# Patient Record
Sex: Female | Born: 1975 | Race: White | Hispanic: No | Marital: Single | Smoking: Never smoker
Health system: Southern US, Community
[De-identification: ages and names within clinical notes are randomized; demographics above are authoritative.]

## PROBLEM LIST (undated history)

## (undated) DIAGNOSIS — F419 Anxiety disorder, unspecified: Secondary | ICD-10-CM

## (undated) DIAGNOSIS — E7800 Pure hypercholesterolemia, unspecified: Secondary | ICD-10-CM

## (undated) DIAGNOSIS — G4700 Insomnia, unspecified: Secondary | ICD-10-CM

## (undated) HISTORY — DX: Anxiety disorder, unspecified: F41.9

## (undated) HISTORY — DX: Insomnia, unspecified: G47.00

---

## 1999-03-31 ENCOUNTER — Other Ambulatory Visit: Admission: RE | Admit: 1999-03-31 | Discharge: 1999-03-31 | Payer: Self-pay | Admitting: Obstetrics and Gynecology

## 2000-04-06 ENCOUNTER — Other Ambulatory Visit: Admission: RE | Admit: 2000-04-06 | Discharge: 2000-04-06 | Payer: Self-pay | Admitting: Obstetrics and Gynecology

## 2001-07-02 ENCOUNTER — Other Ambulatory Visit: Admission: RE | Admit: 2001-07-02 | Discharge: 2001-07-02 | Payer: Self-pay | Admitting: Obstetrics and Gynecology

## 2002-07-14 ENCOUNTER — Other Ambulatory Visit: Admission: RE | Admit: 2002-07-14 | Discharge: 2002-07-14 | Payer: Self-pay | Admitting: Obstetrics and Gynecology

## 2003-07-23 ENCOUNTER — Other Ambulatory Visit: Admission: RE | Admit: 2003-07-23 | Discharge: 2003-07-23 | Payer: Self-pay | Admitting: Obstetrics and Gynecology

## 2004-07-27 ENCOUNTER — Other Ambulatory Visit: Admission: RE | Admit: 2004-07-27 | Discharge: 2004-07-27 | Payer: Self-pay | Admitting: Obstetrics and Gynecology

## 2005-05-13 ENCOUNTER — Emergency Department (HOSPITAL_COMMUNITY): Admission: EM | Admit: 2005-05-13 | Discharge: 2005-05-13 | Payer: Self-pay | Admitting: Emergency Medicine

## 2011-12-24 ENCOUNTER — Ambulatory Visit: Payer: 59 | Admitting: Family Medicine

## 2011-12-24 ENCOUNTER — Ambulatory Visit: Payer: 59

## 2011-12-24 VITALS — BP 110/71 | HR 110 | Temp 98.0°F | Resp 16 | Ht 67.25 in | Wt 106.8 lb

## 2011-12-24 DIAGNOSIS — M549 Dorsalgia, unspecified: Secondary | ICD-10-CM

## 2011-12-24 MED ORDER — PREDNISONE 20 MG PO TABS: ORAL_TABLET | ORAL | Status: DC

## 2011-12-24 MED ORDER — CYCLOBENZAPRINE HCL 10 MG PO TABS: 10.0000 mg | ORAL_TABLET | Freq: Two times a day (BID) | ORAL | Status: DC | PRN

## 2011-12-24 NOTE — Patient Instructions (Addendum)
Please let me know if your back is not doing a lot better in the next few days. You may also want to try some aleve or ibuprofen.

## 2011-12-24 NOTE — Progress Notes (Signed)
[facility] [address], [place] [address] [phone]  Date:  12/24/2011   Name:  Lucely Leard   DOB:  06-05-1975   MRN:  [id number]  PCP:  Almedia Balls, MD    Chief Complaint: Back Pain   History of Present Illness:  Biviana Saddler is a 36 y.o. very pleasant female patient who presents with the following:  She has noted pain in her back intermittently for the last several months- since July. It has been mostly on the left, but can hurt across her entire back.  The pain will radiate a little ways into her left buttock but does not run down the leg.  No numbness or weakness of her leg. no problems with bowel or bladder function.   She does not recall any injury to her back.  About 5 years ago she had "major lower back pain" and was told that she was constipated on her x-ray- she took laxatives and got better.  However, she has tried laxatives this time as well and it did not help.  She does not have any abdominal symptoms this time  She is generally healthy otherwise.    She is on OCP and states there is no chance of pregnancy Her LMP was this past thursday  There is no problem list on file for this patient.   History reviewed. No pertinent past medical history.  History reviewed. No pertinent past surgical history.  History  Substance Use Topics  . Smoking status: Never Smoker   . Smokeless tobacco: Not on file  . Alcohol Use: Not on file    Family History  Problem Relation Age of Onset  . Stroke Maternal Grandmother   . Heart disease Maternal Grandfather     No Known Allergies  Medication list has been reviewed and updated.  Current Outpatient Prescriptions on File Prior to Visit  Medication Sig Dispense Refill  . Norgestimate-Ethinyl Estradiol Triphasic (ORTHO TRI-CYCLEN LO) 0.18/0.215/0.25 MG-25 MCG tab Take 1 tablet by mouth daily.        Review of Systems:  As per HPI- otherwise negative.   Physical Examination: Filed Vitals:     12/24/11 1227  BP: 110/71  Pulse: 110  Temp: 98 F (36.7 C)  Resp: 16   Filed Vitals:   12/24/11 1227  Height: 5' 7.25" (1.708 m)  Weight: 106 lb 12.8 oz (48.444 kg)   Body mass index is 16.60 kg/(m^2). Ideal Body Weight: Weight in (lb) to have BMI = 25: 160.5   GEN: WDWN, NAD, Non-toxic, A & O x 3, thin build HEENT: Atraumatic, Normocephalic. Neck supple. No masses, No LAD. Ears and Nose: No external deformity. CV: RRR, No M/G/R. No JVD. No thrill. No extra heart sounds. PULM: CTA B, no wheezes, crackles, rhonchi. No retractions. No resp. distress. No accessory muscle use. ABD: S, NT, ND EXTR: No c/c/e NEURO Normal gait.  PSYCH: Normally interactive. Conversant. Not depressed or anxious appearing.  Calm demeanor.  Back: she has tenderness and spasm in her left lumbar muscles.  Positive SLR on left, slightly positive on right.  Constricted back flexion and extension.  Legs with normal strength, sensation and DTR.    [facility] reading (PRIMARY) by  Dr. Patsy Lager. Lumbar spine: mild lumbar scoliosis, otherwise normal   LUMBAR SPINE - COMPLETE 4+ VIEW  Comparison: 05/13/2005  Findings: Stable mild thoracolumbar levoscoliosis apex T12-L1 without underlying vertebral anomaly. Negative for fracture, dislocation, or other acute bony abnormality. Vertebral body and disc heights  well maintained throughout. No significant osseous degenerative change.  IMPRESSION:  Stable mild thoracolumbar levoscoliosis without fracture or other acute abnormality.  Assessment and Plan: 1. Back pain  DG Lumbar Spine Complete, predniSONE (DELTASONE) 20 MG tablet, cyclobenzaprine (FLEXERIL) 10 MG tablet   Lakrisha is having back pain which may be associated with nerve compression. She has a positive SLR on the left. We will treat with a short course of prednisone and PRN flexeril, as well as NSAIDs. She decline any other pain relief. She will let me know if not feeling better in the next few days - Sooner  if worse.   Meds ordered this encounter  Medications  . Norgestimate-Ethinyl Estradiol Triphasic (ORTHO TRI-CYCLEN LO) 0.18/0.215/0.25 MG-25 MCG tab    Sig: Take 1 tablet by mouth daily.  . predniSONE (DELTASONE) 20 MG tablet    Sig: Take 2 pills for 3 days, then 1 pill for 3 days    Dispense:  9 tablet    Refill:  0  . cyclobenzaprine (FLEXERIL) 10 MG tablet    Sig: Take 1 tablet (10 mg total) by mouth 2 (two) times daily as needed for muscle spasms.    Dispense:  30 tablet    Refill:  0     Lachae Hohler, MD

## 2014-08-21 ENCOUNTER — Emergency Department (HOSPITAL_COMMUNITY)
Admission: EM | Admit: 2014-08-21 | Discharge: 2014-08-21 | Disposition: A | Discharge status: Home / Self Care | Payer: 59 | Attending: Emergency Medicine | Admitting: Emergency Medicine

## 2014-08-21 ENCOUNTER — Encounter (HOSPITAL_COMMUNITY): Payer: Self-pay | Admitting: Emergency Medicine

## 2014-08-21 ENCOUNTER — Emergency Department (HOSPITAL_COMMUNITY): Payer: 59

## 2014-08-21 DIAGNOSIS — N946 Dysmenorrhea, unspecified: Secondary | ICD-10-CM | POA: Diagnosis not present

## 2014-08-21 DIAGNOSIS — Z793 Long term (current) use of hormonal contraceptives: Secondary | ICD-10-CM | POA: Diagnosis not present

## 2014-08-21 DIAGNOSIS — R1030 Lower abdominal pain, unspecified: Secondary | ICD-10-CM

## 2014-08-21 DIAGNOSIS — R52 Pain, unspecified: Secondary | ICD-10-CM

## 2014-08-21 LAB — CBC
HCT: 40.3 % (ref 36.0–46.0)
HEMOGLOBIN: 13.2 g/dL (ref 12.0–15.0)
MCH: 29.5 pg (ref 26.0–34.0)
MCHC: 32.8 g/dL (ref 30.0–36.0)
MCV: 90 fL (ref 78.0–100.0)
PLATELETS: 306 10*3/uL (ref 150–400)
RBC: 4.48 MIL/uL (ref 3.87–5.11)
RDW: 13.7 % (ref 11.5–15.5)
WBC: 8.6 10*3/uL (ref 4.0–10.5)

## 2014-08-21 LAB — BASIC METABOLIC PANEL
ANION GAP: 12 (ref 5–15)
BUN: 13 mg/dL (ref 6–20)
CALCIUM: 9 mg/dL (ref 8.9–10.3)
CO2: 18 mmol/L — ABNORMAL LOW (ref 22–32)
CREATININE: 0.67 mg/dL (ref 0.44–1.00)
Chloride: 107 mmol/L (ref 101–111)
Glucose, Bld: 99 mg/dL (ref 65–99)
Potassium: 3.4 mmol/L — ABNORMAL LOW (ref 3.5–5.1)
Sodium: 137 mmol/L (ref 135–145)

## 2014-08-21 MED ORDER — HYDROMORPHONE HCL 1 MG/ML IJ SOLN
1.0000 mg | Freq: Once | INTRAMUSCULAR | Status: AC
Start: 2014-08-21 — End: 2014-08-21
  Administered 2014-08-21: 1 mg via INTRAVENOUS
  Filled 2014-08-21: qty 1

## 2014-08-21 MED ORDER — ONDANSETRON HCL 4 MG/2ML IJ SOLN
4.0000 mg | Freq: Once | INTRAMUSCULAR | Status: AC
  Administered 2014-08-21: 4 mg via INTRAVENOUS
  Filled 2014-08-21: qty 2

## 2014-08-21 MED ORDER — SODIUM CHLORIDE 0.9 % IV BOLUS (SEPSIS)
1000.0000 mL | Freq: Once | INTRAVENOUS | Status: AC
  Administered 2014-08-21: 1000 mL via INTRAVENOUS

## 2014-08-21 MED ORDER — IBUPROFEN 800 MG PO TABS: 800.0000 mg | ORAL_TABLET | Freq: Three times a day (TID) | ORAL | Status: DC

## 2014-08-21 MED ORDER — HYDROCODONE-ACETAMINOPHEN 5-325 MG PO TABS: 1.0000 | ORAL_TABLET | Freq: Four times a day (QID) | ORAL | Status: DC | PRN

## 2014-08-21 NOTE — ED Notes (Signed)
Pt returned from US

## 2014-08-21 NOTE — ED Notes (Signed)
Pt transported to U/S with U/S tech.

## 2014-08-21 NOTE — Discharge Instructions (Signed)

## 2014-08-21 NOTE — ED Provider Notes (Signed)
CSN: [id number]     Arrival date & time 08/21/14  1537 History   First MD Initiated Contact with Patient 08/21/14 1547     Chief Complaint  Patient presents with  . menstrual pain      (Consider location/radiation/quality/duration/timing/severity/associated sxs/prior Treatment) HPI Comments: Here with recurrent menstrual pain. Having menstrual pain for the past 6 months as worsening. Not having worsening bleeding. Pain worsened acutely about 5 hours ago. Associated nausea but no fever or vomiting.  Patient is a 39 y.o. female presenting with cramps. The history is provided by the patient.  Abdominal Cramping This is a recurrent problem. The current episode started 3 to 5 hours ago. The problem occurs constantly. The problem has not changed since onset.Associated symptoms include abdominal pain. Pertinent negatives include no shortness of breath. Nothing aggravates the symptoms. Nothing relieves the symptoms.    History reviewed. No pertinent past medical history. History reviewed. No pertinent past surgical history. Family History  Problem Relation Age of Onset  . Stroke Maternal Grandmother   . Heart disease Maternal Grandfather    History  Substance Use Topics  . Smoking status: Never Smoker   . Smokeless tobacco: Not on file  . Alcohol Use: No   OB History    No data available     Review of Systems  Constitutional: Negative for fever.  Respiratory: Negative for cough and shortness of breath.   Gastrointestinal: Positive for nausea and abdominal pain. Negative for vomiting.  All other systems reviewed and are negative.     Allergies  Review of patient's allergies indicates no known allergies.  Home Medications   Prior to Admission medications   Medication Sig Start Date End Date Taking? Authorizing Provider  Norgestimate-Ethinyl Estradiol Triphasic (ORTHO TRI-CYCLEN LO) 0.18/0.215/0.25 MG-25 MCG tab Take 1 tablet by mouth daily.   Yes Historical Provider, MD   traMADol (ULTRAM) 50 MG tablet Take 50-100 mg by mouth every 6 (six) hours as needed for moderate pain or severe pain.   Yes Historical Provider, MD   BP 132/78 mmHg  Pulse 79  Temp(Src) 97.9 F (36.6 C) (Oral)  Resp 18  SpO2 100%  LMP 08/21/2014 Physical Exam  Constitutional: She is oriented to person, place, and time. She appears well-developed and well-nourished. No distress.  HENT:  Head: Normocephalic and atraumatic.  Mouth/Throat: Oropharynx is clear and moist.  Eyes: EOM are normal. Pupils are equal, round, and reactive to light.  Neck: Normal range of motion. Neck supple.  Cardiovascular: Normal rate and regular rhythm.  Exam reveals no friction rub.   No murmur heard. Pulmonary/Chest: Effort normal and breath sounds normal. No respiratory distress. She has no wheezes. She has no rales.  Abdominal: Soft. She exhibits no distension. There is tenderness (diffuse, lower abdomen). There is no rebound.  Musculoskeletal: Normal range of motion. She exhibits no edema.  Neurological: She is alert and oriented to person, place, and time. No cranial nerve deficit. She exhibits normal muscle tone. Coordination normal.  Skin: No rash noted. She is not diaphoretic.  Nursing note and vitals reviewed.   ED Course  Procedures (including critical care time) Labs Review Labs Reviewed  CBC  BASIC METABOLIC PANEL  URINALYSIS, ROUTINE W REFLEX MICROSCOPIC (NOT AT [facility])    Imaging Review US Transvaginal Non-ob  08/21/2014   CLINICAL DATA:  Bilateral pelvic pain  EXAM: TRANSABDOMINAL AND TRANSVAGINAL ULTRASOUND OF PELVIS  DOPPLER ULTRASOUND OF OVARIES  TECHNIQUE: Both transabdominal and transvaginal ultrasound examinations of the pelvis were performed.  Transabdominal technique was performed for global imaging of the pelvis including uterus, ovaries, adnexal regions, and pelvic cul-de-sac. It was necessary to proceed with endovaginal exam following the transabdominal exam to visualize the  ovaries.Color and duplex Doppler ultrasound was utilized to evaluate blood flow to the ovaries.  COMPARISON:  None.  FINDINGS: Uterus  Measurements: 10.5 x 5.8 x 5.2 cm. No fibroids or other mass visualized.  Endometrium  Thickness: 18 mm. There is a focal filling defect which appears oval in solid measuring 1.9 x 1.3 x 1.8 cm  Right ovary  Measurements: 2.7 x 1.5 x 2.2 cm. Normal appearance/no adnexal mass.  Left ovary  Measurements: 3.2 x 1.5 x 2.7 cm. Normal appearance/no adnexal mass.  Pulsed Doppler evaluation of both ovaries demonstrates normal low-resistance arterial and venous waveforms.  Other findings  Trace free fluid noted within the pelvis.  IMPRESSION: 1. No explanation for patient's severe bilateral pelvic pain. No evidence for ovarian torsion. 2. Focal filling defect within the endometrium is identified. This may represent a polyp. Small endometrial carcinoma is less favored. Recommend sonohysterogram for further evaluation, prior to hysteroscopy or endometrial biopsy.   Electronically Signed   By: Kerby Moors M.D.   On: 08/21/2014 17:37   US Pelvis Complete  08/21/2014   CLINICAL DATA:  Bilateral pelvic pain  EXAM: TRANSABDOMINAL AND TRANSVAGINAL ULTRASOUND OF PELVIS  DOPPLER ULTRASOUND OF OVARIES  TECHNIQUE: Both transabdominal and transvaginal ultrasound examinations of the pelvis were performed. Transabdominal technique was performed for global imaging of the pelvis including uterus, ovaries, adnexal regions, and pelvic cul-de-sac. It was necessary to proceed with endovaginal exam following the transabdominal exam to visualize the ovaries.Color and duplex Doppler ultrasound was utilized to evaluate blood flow to the ovaries.  COMPARISON:  None.  FINDINGS: Uterus  Measurements: 10.5 x 5.8 x 5.2 cm. No fibroids or other mass visualized.  Endometrium  Thickness: 18 mm. There is a focal filling defect which appears oval in solid measuring 1.9 x 1.3 x 1.8 cm  Right ovary  Measurements: 2.7 x 1.5  x 2.2 cm. Normal appearance/no adnexal mass.  Left ovary  Measurements: 3.2 x 1.5 x 2.7 cm. Normal appearance/no adnexal mass.  Pulsed Doppler evaluation of both ovaries demonstrates normal low-resistance arterial and venous waveforms.  Other findings  Trace free fluid noted within the pelvis.  IMPRESSION: 1. No explanation for patient's severe bilateral pelvic pain. No evidence for ovarian torsion. 2. Focal filling defect within the endometrium is identified. This may represent a polyp. Small endometrial carcinoma is less favored. Recommend sonohysterogram for further evaluation, prior to hysteroscopy or endometrial biopsy.   Electronically Signed   By: Kerby Moors M.D.   On: 08/21/2014 17:37   [place] Art/ven Flow Abd Pelv Doppler  08/21/2014   CLINICAL DATA:  Bilateral pelvic pain  EXAM: TRANSABDOMINAL AND TRANSVAGINAL ULTRASOUND OF PELVIS  DOPPLER ULTRASOUND OF OVARIES  TECHNIQUE: Both transabdominal and transvaginal ultrasound examinations of the pelvis were performed. Transabdominal technique was performed for global imaging of the pelvis including uterus, ovaries, adnexal regions, and pelvic cul-de-sac. It was necessary to proceed with endovaginal exam following the transabdominal exam to visualize the ovaries.Color and duplex Doppler ultrasound was utilized to evaluate blood flow to the ovaries.  COMPARISON:  None.  FINDINGS: Uterus  Measurements: 10.5 x 5.8 x 5.2 cm. No fibroids or other mass visualized.  Endometrium  Thickness: 18 mm. There is a focal filling defect which appears oval in solid measuring 1.9 x 1.3 x 1.8 cm  Right ovary  Measurements: 2.7 x 1.5 x 2.2 cm. Normal appearance/no adnexal mass.  Left ovary  Measurements: 3.2 x 1.5 x 2.7 cm. Normal appearance/no adnexal mass.  Pulsed Doppler evaluation of both ovaries demonstrates normal low-resistance arterial and venous waveforms.  Other findings  Trace free fluid noted within the pelvis.  IMPRESSION: 1. No explanation for patient's severe  bilateral pelvic pain. No evidence for ovarian torsion. 2. Focal filling defect within the endometrium is identified. This may represent a polyp. Small endometrial carcinoma is less favored. Recommend sonohysterogram for further evaluation, prior to hysteroscopy or endometrial biopsy.   Electronically Signed   By: Kerby Moors M.D.   On: 08/21/2014 17:37     EKG Interpretation None      MDM   Final diagnoses:  Diffuse pain  Lower abdominal pain  Dysmenorrhea    38 year old female here with acutely worsening lower abdominal pain. Concern for torsion. Will obtain ultrasound. Pain meds and fluids given.  Ultrasound normal. Patient feeling better after pain meds. Patient's dysmenorrhea has been chronic and will give pain meds and follow-up with gynecology. Patient refuses any further pelvic exam after her ultrasound.  Evelina Bucy, MD 08/22/14 Laureen Abrahams

## 2014-08-21 NOTE — ED Notes (Signed)
Per pt, states severe menstrual pain, cramping-feels worse than labor-has taken 3 tramadol with no relief

## 2015-06-22 ENCOUNTER — Emergency Department (HOSPITAL_COMMUNITY): Payer: 59

## 2015-06-22 ENCOUNTER — Emergency Department (HOSPITAL_COMMUNITY)
Admission: EM | Admit: 2015-06-22 | Discharge: 2015-06-22 | Disposition: A | Discharge status: Home / Self Care | Payer: 59 | Attending: Emergency Medicine | Admitting: Emergency Medicine

## 2015-06-22 ENCOUNTER — Encounter (HOSPITAL_COMMUNITY): Payer: Self-pay

## 2015-06-22 DIAGNOSIS — R5383 Other fatigue: Secondary | ICD-10-CM

## 2015-06-22 DIAGNOSIS — R059 Cough, unspecified: Secondary | ICD-10-CM

## 2015-06-22 DIAGNOSIS — F419 Anxiety disorder, unspecified: Secondary | ICD-10-CM | POA: Insufficient documentation

## 2015-06-22 DIAGNOSIS — R42 Dizziness and giddiness: Secondary | ICD-10-CM | POA: Diagnosis not present

## 2015-06-22 DIAGNOSIS — R002 Palpitations: Secondary | ICD-10-CM | POA: Diagnosis present

## 2015-06-22 DIAGNOSIS — Z8709 Personal history of other diseases of the respiratory system: Secondary | ICD-10-CM | POA: Insufficient documentation

## 2015-06-22 DIAGNOSIS — Z79899 Other long term (current) drug therapy: Secondary | ICD-10-CM | POA: Insufficient documentation

## 2015-06-22 DIAGNOSIS — R05 Cough: Secondary | ICD-10-CM | POA: Insufficient documentation

## 2015-06-22 MED ORDER — LORAZEPAM 1 MG PO TABS
1.0000 mg | ORAL_TABLET | Freq: Once | ORAL | Status: AC
  Administered 2015-06-22: 1 mg via ORAL
  Filled 2015-06-22: qty 1

## 2015-06-22 MED ORDER — LORAZEPAM 1 MG PO TABS: 1.0000 mg | ORAL_TABLET | Freq: Three times a day (TID) | ORAL | Status: DC | PRN

## 2015-06-22 NOTE — Discharge Instructions (Signed)
Fatigue  Fatigue is feeling tired all of the time, a lack of energy, or a lack of motivation. Occasional or mild fatigue is often a normal response to activity or life in general. However, long-lasting (chronic) or extreme fatigue may indicate an underlying medical condition.  HOME CARE INSTRUCTIONS   Watch your fatigue for any changes. The following actions may help to lessen any discomfort you are feeling:  · Talk to your health care provider about how much sleep you need each night. Try to get the required amount every night.  · Take medicines only as directed by your health care provider.  · Eat a healthy and nutritious diet. Ask your health care provider if you need help changing your diet.  · Drink enough fluid to keep your urine clear or pale yellow.  · Practice ways of relaxing, such as yoga, meditation, massage therapy, or acupuncture.  · Exercise regularly.    · Change situations that cause you stress. Try to keep your work and personal routine reasonable.  · Do not abuse illegal drugs.  · Limit alcohol intake to no more than 1 drink per day for nonpregnant women and 2 drinks per day for men. One drink equals 12 ounces of beer, 5 ounces of wine, or 1½ ounces of hard liquor.  · Take a multivitamin, if directed by your health care provider.  SEEK MEDICAL CARE IF:   · Your fatigue does not get better.  · You have a fever.    · You have unintentional weight loss or gain.  · You have headaches.    · You have difficulty:      Falling asleep.    Sleeping throughout the night.  · You feel angry, guilty, anxious, or sad.     · You are unable to have a bowel movement (constipation).    · You skin is dry.     · Your legs or another part of your body is swollen.    SEEK IMMEDIATE MEDICAL CARE IF:   · You feel confused.    · Your vision is blurry.  · You feel faint or pass out.    · You have a severe headache.    · You have severe abdominal, pelvic, or back pain.    · You have chest pain, shortness of breath, or an  irregular or fast heartbeat.    · You are unable to urinate or you urinate less than normal.    · You develop abnormal bleeding, such as bleeding from the rectum, vagina, nose, lungs, or nipples.  · You vomit blood.     · You have thoughts about harming yourself or committing suicide.    · You are worried that you might harm someone else.       This information is not intended to replace advice given to you by your health care provider. Make sure you discuss any questions you have with your health care provider.     Document Released: 12/11/2006 Document Revised: 03/06/2014 Document Reviewed: 06/17/2013  Elsevier Interactive Patient Education ©2016 [employer]

## 2015-06-22 NOTE — ED Notes (Signed)
Pt here with nausea, fatigue, loss of appetite, no fever, no chest pain. Cough, dull pain with cough, some shortness of breath.

## 2015-06-22 NOTE — ED Notes (Signed)
Patient was alert, oriented and stable upon discharge. RN went over AVS and patient had no further questions. Pt advised not to drive on ativan.

## 2015-06-22 NOTE — ED Provider Notes (Signed)
CSN: [id number]     Arrival date & time 06/22/15  1832 History  By signing my name below, I, Emmanuella Mensah, attest that this documentation has been prepared under the direction and in the presence of Delos Haring, PA-C. Electronically Signed: Judithann Sauger, ED Scribe. 06/22/2015. 9:21 PM.    Chief Complaint  Patient presents with  . Fatigue  . Cough   The history is provided by the patient. No language interpreter was used.   HPI Comments: Lacey Hernandez is a 40 y.o. female who presents to the Emergency Department complaining of persistent intermittent cough onset 2 weeks ago. Pt reports associated fatigue, palpitations, and severe anxiety. She states that she has increased stress as she has been researching what could be wrong with her on [place] and taking Ambien to fall asleep which usually makes her more anxious and not sleep. Pt explains that she was diagnosed with influenza approx. 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 She adds that she went to [facility] 4 days ago where she had normal CBC w /dif and TSH/free 4 test. No alleviating factors noted. Pt has not tried any medications PTA. She said that she is so anxious and thinks she needs an antibiotic. She said she has never been sick before so this is new territory for her. Patient has her knees balled into her chest and her face buried into her scarf. Denies HI/SI, AVH. Substance abuse or alcohol abuse. Denies wanting psych consult. She denies any fever, chills, sore throat, rhinorrhea, HA, or n/v.    History reviewed. No pertinent past medical history. History reviewed. No pertinent past surgical history. Family History  Problem Relation Age of Onset  . Stroke Maternal Grandmother   . Heart disease Maternal Grandfather    Social History  Substance Use Topics  . Smoking status: Never Smoker   . Smokeless tobacco: None  . Alcohol Use: No   OB History     No data available     Review of Systems  Constitutional: Positive for fatigue. Negative for fever and chills.  HENT: Negative for rhinorrhea and sore throat.   Respiratory: Positive for cough and shortness of breath.   Gastrointestinal: Negative for nausea and vomiting.  Neurological: Negative for headaches.      Allergies  Review of patient's allergies indicates no known allergies.  Home Medications   Prior to Admission medications   Medication Sig Start Date End Date Taking? Authorizing Provider  ALPRAZolam Duanne Moron) 0.25 MG tablet take 1 tablet by mouth three times a day if needed for anxiety 06/20/15  Yes Historical Provider, MD  norethindrone-ethinyl estradiol-iron (ESTROSTEP FE,TILIA FE,TRI-LEGEST FE) 1-20/1-30/1-35 MG-MCG tablet Take 1 tablet by mouth daily. Reported on 06/22/2015   Yes Historical Provider, MD  zolpidem (AMBIEN) 5 MG tablet Take 5 mg by mouth at bedtime.    Yes Historical Provider, MD  HYDROcodone-acetaminophen (NORCO/VICODIN) 5-325 MG per tablet Take 1 tablet by mouth every 6 (six) hours as needed for moderate pain. Patient not taking: Reported on 06/22/2015 08/21/14   Evelina Bucy, MD  ibuprofen (ADVIL,MOTRIN) 800 MG tablet Take 1 tablet (800 mg total) by mouth 3 (three) times daily. Patient not taking: Reported on 06/22/2015 08/21/14   Evelina Bucy, MD  LORazepam (ATIVAN) 1 MG tablet Take 1 tablet (1 mg total) by mouth 3 (three) times daily as needed for anxiety. 06/22/15   Tayvia Faughnan Carlota Raspberry, PA-C   BP 111/50  mmHg  Pulse 92  Temp(Src) 98 F (36.7 C) (Oral)  Resp 20  SpO2 99%  LMP 05/12/2015 Physical Exam  Constitutional: She is oriented to person, place, and time. She appears well-developed and well-nourished. No distress.  HENT:  Head: Normocephalic and atraumatic.  Eyes: Conjunctivae and EOM are normal.  Cardiovascular: Normal rate.   Pulmonary/Chest: Effort normal and breath sounds normal. No accessory muscle usage. No respiratory distress. She has no  decreased breath sounds. She has no wheezes. She has no rhonchi.  Abdominal: Bowel sounds are normal. There is no tenderness. There is no rigidity, no rebound and no guarding.  Musculoskeletal: Normal range of motion.  Neurological: She is alert and oriented to person, place, and time.  Skin: Skin is warm and dry.  Psychiatric: Her behavior is normal. Her mood appears anxious. Her speech is rapid and/or pressured. She is not actively hallucinating. Thought content is not paranoid and not delusional. She does not exhibit a depressed mood. She expresses no homicidal and no suicidal ideation. She expresses no suicidal plans and no homicidal plans.  Nursing note and vitals reviewed.   ED Course  Procedures (including critical care time) DIAGNOSTIC STUDIES: Oxygen Saturation is 99% on RA, normal by my interpretation.    COORDINATION OF CARE: 9:14 PM- Pt advised of plan for treatment and pt agrees. Pt informed of x-ray results. Pt will receive Ativan. Advised to stop taking Ambien.    Labs Review Labs Reviewed - No data to display  Imaging Review Dg Chest 2 View  06/22/2015  CLINICAL DATA:  Cough, congestion shortness of breath for 1 day. Patient diagnosed with flu 2 weeks ago. Initial encounter. EXAM: CHEST  2 VIEW COMPARISON:  None. FINDINGS: The lungs are clear. Heart size is normal. No pneumothorax or pleural effusion. No focal bony abnormality. IMPRESSION: Negative chest. Electronically Signed   By: Inge Rise M.D.   On: 06/22/2015 19:37   Geneen Dieter Carlota Raspberry, PA-C has personally reviewed and evaluated these images and lab results as part of her medical decision-making.   EKG Interpretation None      MDM   Final diagnoses:  Other fatigue  Cough  Anxiety    The patient is very anxious, given Ativan in the ED which she says has helped. Because of her anxiety she refuses blood work today. She wants to try treating her anxiety first and then see her PCP or return to the ED for  work-up. She reports being "deathly afraid" of needles and can't do it. She appears well with normal vital signs. Had a normal CBC w/dif and TSH. I think her symptoms are related to the anxiety and her recent bought of flu.  I personally performed the services described in this documentation, which was scribed in my presence. The recorded information has been reviewed and is accurate.   Delos Haring, PA-C 06/22/15 2158  Wandra Arthurs, MD 06/24/15 Vernelle Emerald

## 2015-07-08 ENCOUNTER — Other Ambulatory Visit: Payer: Self-pay | Admitting: Obstetrics and Gynecology

## 2015-07-29 DIAGNOSIS — L988 Other specified disorders of the skin and subcutaneous tissue: Secondary | ICD-10-CM | POA: Insufficient documentation

## 2015-10-06 ENCOUNTER — Ambulatory Visit
Admission: RE | Admit: 2015-10-06 | Discharge: 2015-10-06 | Disposition: A | Discharge status: Home / Self Care | Payer: 59 | Source: Ambulatory Visit | Attending: Obstetrics and Gynecology | Admitting: Obstetrics and Gynecology

## 2015-10-06 ENCOUNTER — Other Ambulatory Visit: Payer: Self-pay | Admitting: Obstetrics and Gynecology

## 2015-10-06 DIAGNOSIS — R928 Other abnormal and inconclusive findings on diagnostic imaging of breast: Secondary | ICD-10-CM

## 2017-10-16 ENCOUNTER — Emergency Department (HOSPITAL_COMMUNITY): Payer: Managed Care, Other (non HMO)

## 2017-10-16 ENCOUNTER — Emergency Department (HOSPITAL_COMMUNITY)
Admission: EM | Admit: 2017-10-16 | Discharge: 2017-10-16 | Disposition: A | Discharge status: Home / Self Care | Payer: Managed Care, Other (non HMO) | Attending: Emergency Medicine | Admitting: Emergency Medicine

## 2017-10-16 ENCOUNTER — Encounter (HOSPITAL_COMMUNITY): Payer: Self-pay

## 2017-10-16 ENCOUNTER — Other Ambulatory Visit: Payer: Self-pay

## 2017-10-16 DIAGNOSIS — N10 Acute tubulo-interstitial nephritis: Secondary | ICD-10-CM | POA: Diagnosis not present

## 2017-10-16 DIAGNOSIS — R109 Unspecified abdominal pain: Secondary | ICD-10-CM | POA: Diagnosis present

## 2017-10-16 DIAGNOSIS — N12 Tubulo-interstitial nephritis, not specified as acute or chronic: Secondary | ICD-10-CM

## 2017-10-16 DIAGNOSIS — Z79899 Other long term (current) drug therapy: Secondary | ICD-10-CM | POA: Insufficient documentation

## 2017-10-16 LAB — URINALYSIS, ROUTINE W REFLEX MICROSCOPIC
Bilirubin Urine: NEGATIVE
GLUCOSE, UA: NEGATIVE mg/dL
KETONES UR: 20 mg/dL — AB
NITRITE: POSITIVE — AB
PH: 6 (ref 5.0–8.0)
PROTEIN: NEGATIVE mg/dL
Specific Gravity, Urine: 1.014 (ref 1.005–1.030)

## 2017-10-16 LAB — CBC
HCT: 37.7 % (ref 36.0–46.0)
Hemoglobin: 12.1 g/dL (ref 12.0–15.0)
MCH: 28.3 pg (ref 26.0–34.0)
MCHC: 32.1 g/dL (ref 30.0–36.0)
MCV: 88.1 fL (ref 78.0–100.0)
PLATELETS: 320 10*3/uL (ref 150–400)
RBC: 4.28 MIL/uL (ref 3.87–5.11)
RDW: 15 % (ref 11.5–15.5)
WBC: 10.2 10*3/uL (ref 4.0–10.5)

## 2017-10-16 LAB — COMPREHENSIVE METABOLIC PANEL
ALK PHOS: 76 U/L (ref 38–126)
ALT: 14 U/L (ref 0–44)
AST: 17 U/L (ref 15–41)
Albumin: 4.1 g/dL (ref 3.5–5.0)
Anion gap: 12 (ref 5–15)
BILIRUBIN TOTAL: 0.9 mg/dL (ref 0.3–1.2)
BUN: 14 mg/dL (ref 6–20)
CALCIUM: 8.8 mg/dL — AB (ref 8.9–10.3)
CO2: 20 mmol/L — ABNORMAL LOW (ref 22–32)
Chloride: 110 mmol/L (ref 98–111)
Creatinine, Ser: 0.93 mg/dL (ref 0.44–1.00)
GFR calc non Af Amer: >60 mL/min (ref >60)
Glucose, Bld: 115 mg/dL — ABNORMAL HIGH (ref 70–99)
Potassium: 3.2 mmol/L — ABNORMAL LOW (ref 3.5–5.1)
Sodium: 142 mmol/L (ref 135–145)
TOTAL PROTEIN: 7.2 g/dL (ref 6.5–8.1)

## 2017-10-16 LAB — I-STAT BETA HCG BLOOD, ED (MC, WL, AP ONLY): I-stat hCG, quantitative: <5 m[IU]/mL (ref <5)

## 2017-10-16 MED ORDER — IBUPROFEN 800 MG PO TABS: 800.0000 mg | ORAL_TABLET | Freq: Three times a day (TID) | ORAL | 0 refills | Status: DC | PRN

## 2017-10-16 MED ORDER — HYDROMORPHONE HCL 1 MG/ML IJ SOLN
1.0000 mg | Freq: Once | INTRAMUSCULAR | Status: AC
  Administered 2017-10-16: 1 mg via INTRAVENOUS
  Filled 2017-10-16: qty 1

## 2017-10-16 MED ORDER — SODIUM CHLORIDE 0.9 % IV SOLN
1.0000 g | Freq: Once | INTRAVENOUS | Status: AC
  Administered 2017-10-16: 1 g via INTRAVENOUS
  Filled 2017-10-16: qty 10

## 2017-10-16 MED ORDER — ONDANSETRON 4 MG PO TBDP: 4.0000 mg | ORAL_TABLET | Freq: Three times a day (TID) | ORAL | 0 refills | Status: DC | PRN

## 2017-10-16 MED ORDER — CEPHALEXIN 500 MG PO CAPS: 500.0000 mg | ORAL_CAPSULE | Freq: Four times a day (QID) | ORAL | 0 refills | Status: DC

## 2017-10-16 MED ORDER — POTASSIUM CHLORIDE CRYS ER 20 MEQ PO TBCR: 20.0000 meq | EXTENDED_RELEASE_TABLET | Freq: Every day | ORAL | 0 refills | Status: DC

## 2017-10-16 MED ORDER — OXYCODONE-ACETAMINOPHEN 5-325 MG PO TABS: 1.0000 | ORAL_TABLET | Freq: Four times a day (QID) | ORAL | 0 refills | Status: DC | PRN

## 2017-10-16 MED ORDER — KETOROLAC TROMETHAMINE 15 MG/ML IJ SOLN
15.0000 mg | Freq: Once | INTRAMUSCULAR | Status: AC
  Administered 2017-10-16: 15 mg via INTRAVENOUS
  Filled 2017-10-16: qty 1

## 2017-10-16 MED ORDER — MORPHINE SULFATE (PF) 4 MG/ML IV SOLN
4.0000 mg | Freq: Once | INTRAVENOUS | Status: AC
  Administered 2017-10-16: 4 mg via INTRAVENOUS
  Filled 2017-10-16: qty 1

## 2017-10-16 MED ORDER — SODIUM CHLORIDE 0.9 % IV BOLUS
1000.0000 mL | Freq: Once | INTRAVENOUS | Status: AC
  Administered 2017-10-16: 1000 mL via INTRAVENOUS

## 2017-10-16 MED ORDER — ONDANSETRON HCL 4 MG/2ML IJ SOLN
4.0000 mg | Freq: Once | INTRAMUSCULAR | Status: AC
  Administered 2017-10-16: 4 mg via INTRAVENOUS
  Filled 2017-10-16: qty 2

## 2017-10-16 NOTE — ED Notes (Signed)
Patient verbalized understanding of discharge instructions, no questions. Patient ambulated out of ED with steady gait in no distress.

## 2017-10-16 NOTE — ED Provider Notes (Signed)
[facility] DEPT Provider Note   CSN: [id number] Arrival date & time: 10/16/17  1013   History   Chief Complaint Chief Complaint  Patient presents with  . Flank Pain  . Urinary Retention  . Urinary Frequency    HPI Lacey Hernandez is a 42 y.o. female without significant past medical history who presents to the emergency department with complaints of left flank pain which is been occurring intermittently for the past 3 days, significantly worse today.  Pain has become constant now, does not radiate, 10 out of 10 in severity, no specific alleviating or aggravating factors.  She did take 800 mg of ibuprofen 1 hour prior to arrival.  She had associated nausea, a few episodes of vomiting, urinary urgency, urinary frequency, and feeling as if she has not emptied her bladder fully.  She has been urinating, not necessarily having urinary retention.  No history of similar symptoms.  Denies chance of pregnancy.  Denies fever, chills, vaginal bleeding, vaginal discharge, dysuria, numbness, weakness, incontinence, saddle anesthesia, IVDU, history of cancer, recent traumatic injury.  Denies history of similar.  HPI  History reviewed. No pertinent past medical history.  There are no active problems to display for this patient.   History reviewed. No pertinent surgical history.   OB History   None      Home Medications    Prior to Admission medications   Medication Sig Start Date End Date Taking? Authorizing Provider  ALPRAZolam Duanne Moron) 0.25 MG tablet take 1 tablet by mouth three times a day if needed for anxiety 06/20/15   [provider]  HYDROcodone-acetaminophen (NORCO/VICODIN) 5-325 MG per tablet Take 1 tablet by mouth every 6 (six) hours as needed for moderate pain. Patient not taking: Reported on 06/22/2015 08/21/14   Evelina Bucy, MD  ibuprofen (ADVIL,MOTRIN) 800 MG tablet Take 1 tablet (800 mg total) by mouth 3 (three) times daily. Patient not  taking: Reported on 06/22/2015 08/21/14   Evelina Bucy, MD  LORazepam (ATIVAN) 1 MG tablet Take 1 tablet (1 mg total) by mouth 3 (three) times daily as needed for anxiety. 06/22/15   Delos Haring, PA-C  norethindrone-ethinyl estradiol-iron (ESTROSTEP FE,TILIA FE,TRI-LEGEST FE) 1-20/1-30/1-35 MG-MCG tablet Take 1 tablet by mouth daily. Reported on 06/22/2015    [provider]  zolpidem (AMBIEN) 5 MG tablet Take 5 mg by mouth at bedtime.     [provider]    Family History Family History  Problem Relation Age of Onset  . Stroke Maternal Grandmother   . Heart disease Maternal Grandfather     Social History Social History   Tobacco Use  . Smoking status: Never Smoker  . Smokeless tobacco: Never Used  Substance Use Topics  . Alcohol use: No  . Drug use: No     Allergies   Patient has no known allergies.   Review of Systems Review of Systems  All other systems reviewed and are negative.    Physical Exam Updated Vital Signs BP 120/74 (BP Location: Right Arm)   Pulse 78   Temp 98.6 F (37 C) (Oral)   Resp 20   Ht 5' 8"  (1.727 m)   Wt 68 kg   LMP 09/25/2017   SpO2 100%   BMI 22.81 kg/m   Physical Exam  Constitutional: She appears well-developed and well-nourished.  Non-toxic appearance. She appears distressed (mild secondary to discomfort).  HENT:  Head: Normocephalic and atraumatic.  Eyes: Conjunctivae are normal. Right eye exhibits no discharge. Left eye exhibits  no discharge.  Neck: Neck supple.  Cardiovascular: Normal rate and regular rhythm.  No murmur heard. Pulmonary/Chest: Effort normal and breath sounds normal. No respiratory distress. She has no wheezes. She has no rhonchi. She has no rales.  Respiration even and unlabored  Abdominal: Soft. Bowel sounds are normal. She exhibits no distension. There is no tenderness. There is CVA tenderness (L). There is no rebound and no guarding.  Neurological: She is alert.  Clear speech.   Skin:  Skin is warm and dry. No rash noted.  Psychiatric: She has a normal mood and affect. Her behavior is normal.  Nursing note and vitals reviewed.    ED Treatments / Results  Labs (all labs ordered are listed, but only abnormal results are displayed) Labs Reviewed  URINALYSIS, ROUTINE W REFLEX MICROSCOPIC - Abnormal; Notable for the following components:      Result Value   APPearance HAZY (*)    Hgb urine dipstick MODERATE (*)    Ketones, ur 20 (*)    Nitrite POSITIVE (*)    Leukocytes, UA SMALL (*)    WBC, UA >50 (*)    Bacteria, UA MANY (*)    All other components within normal limits  COMPREHENSIVE METABOLIC PANEL - Abnormal; Notable for the following components:   Potassium 3.2 (*)    CO2 20 (*)    Glucose, Bld 115 (*)    Calcium 8.8 (*)    All other components within normal limits  URINE CULTURE  CBC  I-STAT BETA HCG BLOOD, ED ([facility], [facility], [facility] ONLY)    EKG None  Radiology Ct Renal Stone Study  Result Date: 10/16/2017 CLINICAL DATA:  Left flank pain. Urinary frequency and retention for 2 days. EXAM: CT ABDOMEN AND PELVIS WITHOUT CONTRAST TECHNIQUE: Multidetector CT imaging of the abdomen and pelvis was performed following the standard protocol without IV contrast. COMPARISON:  None. FINDINGS: Lower chest: No significant pulmonary nodules or acute consolidative airspace disease. Hepatobiliary: Normal liver size. Simple 1.2 cm posterior right liver lobe cyst. No additional liver lesions. Normal gallbladder with no radiopaque cholelithiasis. No biliary ductal dilatation. Pancreas: Normal, with no mass or duct dilation. Spleen: Normal size. No mass. Adrenals/Urinary Tract: Normal adrenals. There is mild fullness of the central left renal collecting system without overt left hydronephrosis. There is fat stranding surrounding central left renal collecting system. No left renal stones. Punctate nonobstructing upper right renal stone. No right hydronephrosis. Ureters are normal caliber, with  no ureteral stones. No contour deforming renal mass. Normal bladder. Stomach/Bowel: Normal non-distended stomach. Normal caliber small bowel with no small bowel wall thickening. Normal appendix. Normal large bowel with no diverticulosis, large bowel wall thickening or pericolonic fat stranding. Vascular/Lymphatic: Normal caliber abdominal aorta. No pathologically enlarged lymph nodes in the abdomen or pelvis. Reproductive: Grossly normal uterus.  No adnexal mass. Other: No pneumoperitoneum, ascites or focal fluid collection. Small fat containing periumbilical hernia. Musculoskeletal: No aggressive appearing focal osseous lesions. Moderate degenerative disc disease at L5-S1. IMPRESSION: 1. Mild fullness of the central left renal collecting system without overt left hydronephrosis. Fat stranding surrounding the central left renal collecting system. No stones in the left kidney or left ureter. These findings are nonspecific and could be due to recent passage of a left urinary tract stone. An ascending left urinary tract infection is not entirely excluded. 2. Punctate nonobstructing right renal stone. Electronically Signed   By: Ilona Sorrel M.D.   On: 10/16/2017 12:34    Procedures Procedures (including critical care time)  Medications Ordered in ED Medications  ketorolac (TORADOL) 15 MG/ML injection 15 mg (has no administration in time range)  morphine 4 MG/ML injection 4 mg (4 mg Intravenous Given 10/16/17 1059)  ondansetron (ZOFRAN) injection 4 mg (4 mg Intravenous Given 10/16/17 1059)  sodium chloride 0.9 % bolus 1,000 mL (0 mLs Intravenous Stopped 10/16/17 1452)  HYDROmorphone (DILAUDID) injection 1 mg (1 mg Intravenous Given 10/16/17 1141)  cefTRIAXone (ROCEPHIN) 1 g in sodium chloride 0.9 % 100 mL IVPB (0 g Intravenous Stopped 10/16/17 1519)     Initial Impression / Assessment and Plan / ED Course  I have reviewed the triage vital signs and the nursing notes.  Pertinent labs & imaging results that  were available during my care of the patient were reviewed by me and considered in my medical decision making (see chart for details).   Patient presents to the emergency department with complaints of left flank pain and associated urinary symptoms.  Upon arrival patient is nontoxic-appearing, she does appear uncomfortable secondary to pain, her vitals initially are within normal limits.  She does have some left CVA tenderness on exam.  DDX: nephrolithiasis, pyelonephritis/UTI, cholecystitis, bowel obstruction/perforation, appendicitis, pancreatitis, dissection, MSK, ovarian torsion. Given otherwise young healthy female, normotensive, with urinary sxs, doubt dissection. No pelvic pain or abdominal/pelvic tenderness to raise concern for torsion. Again no abdominal tenderness to raise concern for pancreatitis, cholecystitis or bowel obstruction/perforation. Feel nephrolithiasis vs. pyelonephritis is most likely at this time. Will evaluate with labs and CT renal study, analgesics, anti-emetics, and fluids ordered.   No leukocytosis, anemia, or significant electrolyte derangements- mild hypocalcemia/hypokalemia. Renal function and LFTs WNL.  CT renal study reviewed: Mild fullness of the central left renal collecting system without overt left hydronephrosis. Fat stranding surrounding the central left renal collecting system. No stones in the left kidney or left ureter. These findings are nonspecific and could be due to recent passage of a left urinary tract stone. An ascending left urinary tract infection is not entirely excluded. Additionally noted is a punctate nonobstructing right renal stone.  Based on patient's urinalysis I feel that ascending left-sided UTI consistent with pyelonephritis is more likely, she is nitrite positive with many bacteria, many WBCs, some leukocytes, and hematuria.  Renal function is preserved. Passed stone remains a possibility. Patient given a dose of IV Rocephin in the ED, she is  afebrile, she does not meet sepsis criteria, she is tolerating PO with pain control. Will discharge home with pain control, anti-emetics, and abx with PCP vs. Urology follow up. Keflex given, culture sent. Ibuprofen/percocet for pain, [employer] Controlled Substance reporting System queried.  I discussed results, treatment plan, need for follow-up, and return precautions with the patient. Provided opportunity for questions, patient confirmed understanding and is in agreement with plan.   Findings and plan of care discussed with supervising physician Dr. Maryan Rued, in agreement.   Blood pressure 115/74, pulse 64, temperature 98.5 F (36.9 C), temperature source Oral, resp. rate 16, height 5' 8"  (1.727 m), weight 68 kg, last menstrual period 10/02/2017, SpO2 99 %.  Final Clinical Impressions(s) / ED Diagnoses   Final diagnoses:  Flank pain  Pyelonephritis    ED Discharge Orders         Ordered    oxyCODONE-acetaminophen (PERCOCET/ROXICET) 5-325 MG tablet  Every 6 hours PRN     10/16/17 1557    ibuprofen (ADVIL,MOTRIN) 800 MG tablet  Every 8 hours PRN     10/16/17 1557    cephALEXin (KEFLEX) 500 MG  capsule  4 times daily     10/16/17 1557    potassium chloride SA (K-DUR,KLOR-CON) 20 MEQ tablet  Daily     10/16/17 1557    ondansetron (ZOFRAN ODT) 4 MG disintegrating tablet  Every 8 hours PRN     10/16/17 1557           Petrucelli, Glynda Jaeger, PA-C 10/16/17 1601    Blanchie Dessert, MD 10/16/17 2030

## 2017-10-16 NOTE — Discharge Instructions (Addendum)
You were seen in the ER for back/flank pain.    We suspect that your symptoms are related to a urinary tract infection that is ascending to your kidney on the left side, otherwise known as pyelonephritis.  Please see the attached handout regarding further information on pyelonephritis.  If torment possible that she did pass a kidney stone, it is difficult for [place] to establish whether or not this occurred.  Starting on Keflex, and antibiotic to treat the infection, take this 4 times per day for the next 10 days.  We are also sending you home with Zofran, and antinausea medication, take this every 8 hours as needed for nausea and vomiting.  Take ibuprofen 800 mg every 8 hours as needed for pain, stay nonsteroidal anti-inflammatory medication which was given you take with food as it can cause stomach upset and it were stomach bleeding, do not take other NSAIDs with this such as Motrin, Advil, Aleve, naproxen, Mobic, or Goody powders as they are similar medicines.-Do not starting ibuprofen until tomorrow morning as we gave you a shot of something similar here in the ER.  They sending you with perception for Percocet for severe pain. Percocet  is a narcotic/controlled substance medication that has potential addicting qualities.  We recommend that you take 1-2 tablets every 6 hours as needed for severe pain.  Do not drive or operate heavy machinery when taking this medicine as it can be sedating. Do not drink alcohol or take other sedating medications when taking this medicine for safety reasons.  Keep this out of reach of small children.  Please be aware this medicine has Tylenol in it (325 mg/tab) do not exceed the maximum dose of Tylenol in a day per over the counter recommendations should you decide to supplement with Tylenol over the counter.    Additionally your potassium was a bit low therefore we are giving you a supplement to take for the next few days.  We have prescribed you new medication(s) today.  Discuss the medications prescribed today with your pharmacist as they can have adverse effects and interactions with your other medicines including over the counter and prescribed medications. Seek medical evaluation if you start to experience new or abnormal symptoms after taking one of these medicines, seek care immediately if you start to experience difficulty breathing, feeling of your throat closing, facial swelling, or rash as these could be indications of a more serious allergic reaction  You may also take AZO over-the-counter for 48 hours to help with symptoms, please do not exceed use of 48 hours.   Please follow-up with your primary care provider or with the urologist (bladder specialist) in the next 3 days for reassessment.  Return to ER for new or worsening symptoms including but not limited to worsening pain, inability to keep fluids down, fever, or any other concerns that you may have.

## 2017-10-16 NOTE — ED Triage Notes (Signed)
Patient c/o left flank pain, urinary frequency and retention x 2 days.

## 2017-10-19 LAB — URINE CULTURE: Culture: >=100000 — AB

## 2017-10-20 ENCOUNTER — Telehealth: Payer: Self-pay

## 2017-10-20 NOTE — Telephone Encounter (Signed)
Post ED Visit - Positive Culture Follow-up  Culture report reviewed by antimicrobial stewardship pharmacist:  []  Elenor Quinones, Pharm.D. []  Heide Guile, Pharm.D., BCPS AQ-ID []  Parks Neptune, Pharm.D., BCPS []  Alycia Rossetti, Pharm.D., BCPS []  [place], [place].D., BCPS, AAHIVP []  Legrand Como, Pharm.D., BCPS, AAHIVP []  Salome Arnt, PharmD, BCPS []  Johnnette Gourd, PharmD, BCPS []  Hughes Better, PharmD, BCPS [x]  Leeroy Cha, PharmD  Positive urine culture Treated with Cephalexin, organism sensitive to the same and no further patient follow-up is required at this time.  Genia Del 10/20/2017, 9:37 AM

## 2019-10-10 ENCOUNTER — Other Ambulatory Visit: Payer: Self-pay | Admitting: Obstetrics and Gynecology

## 2019-10-10 DIAGNOSIS — N6489 Other specified disorders of breast: Secondary | ICD-10-CM

## 2019-10-23 ENCOUNTER — Other Ambulatory Visit: Payer: Managed Care, Other (non HMO)

## 2019-10-24 ENCOUNTER — Ambulatory Visit
Admission: RE | Admit: 2019-10-24 | Discharge: 2019-10-24 | Disposition: A | Discharge status: Home / Self Care | Payer: Managed Care, Other (non HMO) | Source: Ambulatory Visit | Attending: Obstetrics and Gynecology | Admitting: Obstetrics and Gynecology

## 2019-10-24 ENCOUNTER — Other Ambulatory Visit: Payer: Self-pay

## 2019-10-24 DIAGNOSIS — N6489 Other specified disorders of breast: Secondary | ICD-10-CM

## 2020-02-28 HISTORY — PX: MOUTH SURGERY: SHX715

## 2020-03-30 ENCOUNTER — Encounter: Payer: Self-pay | Admitting: Physician Assistant

## 2020-04-14 ENCOUNTER — Encounter: Payer: Self-pay | Admitting: Physician Assistant

## 2020-04-14 ENCOUNTER — Ambulatory Visit: Payer: Managed Care, Other (non HMO) | Admitting: Physician Assistant

## 2020-04-14 VITALS — BP 110/70 | HR 72 | Ht 68.0 in | Wt 163.0 lb

## 2020-04-14 DIAGNOSIS — R1013 Epigastric pain: Secondary | ICD-10-CM

## 2020-04-14 DIAGNOSIS — R1314 Dysphagia, pharyngoesophageal phase: Secondary | ICD-10-CM | POA: Diagnosis not present

## 2020-04-14 DIAGNOSIS — K219 Gastro-esophageal reflux disease without esophagitis: Secondary | ICD-10-CM | POA: Diagnosis not present

## 2020-04-14 NOTE — Progress Notes (Signed)
Chief Complaint: GERD, dysphagia, nausea and vomiting  HPI:    Lacey Hernandez is a 45 year old female with a past medical history of anxiety and others listed below, who was referred to me by Delilah Shan, MD for a complaint of GERD, dysphagia nausea and vomiting.      Today, the patient tells me that over the past year she has had episodes where she would eat and then go to sleep and wake up vomiting.  Tells me that this occurs now about 1 time a month and seems to be more likely if she overeats in the evening.  Lately she has been trying to be very careful about not eating too late into the night as this also seemed to worsen problems.  Tells me along with this she has some bloating and discomfort in her abdomen.  Apparently was started on Prilosec 20 mg once daily by the urgent care and when taking this for 5 days her symptoms are much better, but tells me "I am bad at taking medicine".  Also describes that food seems to get stuck in her throat on the way down.  Denies regurgitation.    Mother has a history of ulcerative colitis.    Denies fever, chills, diarrhea, constipation, blood in her stool or weight loss.  Past Medical History:  Diagnosis Date  . Anxiety   . Insomnia     History reviewed. No pertinent surgical history.  Current Outpatient Medications  Medication Sig Dispense Refill  . ALPRAZolam (XANAX) 0.5 MG tablet Take 0.5 mg by mouth at bedtime.  2  . mirtazapine (REMERON) 15 MG tablet Take 15 mg by mouth at bedtime.     No current facility-administered medications for this visit.    Allergies as of 04/14/2020  . (No Known Allergies)    Family History  Problem Relation Age of Onset  . Stroke Maternal Grandmother   . Heart disease Maternal Grandfather     Social History   Socioeconomic History  . Marital status: Single    Spouse name: Not on file  . Number of children: Not on file  . Years of education: Not on file  . Highest education level: Not on file   Occupational History  . Occupation: Pharmacist, hospital   Tobacco Use  . Smoking status: Never Smoker  . Smokeless tobacco: Never Used  Vaping Use  . Vaping Use: Never used  Substance and Sexual Activity  . Alcohol use: No  . Drug use: No  . Sexual activity: Yes  Other Topics Concern  . Not on file  Social History Narrative  . Not on file   Social Determinants of Health   Financial Resource Strain: Not on file  Food Insecurity: Not on file  Transportation Needs: Not on file  Physical Activity: Not on file  Stress: Not on file  Social Connections: Not on file  Intimate Partner Violence: Not on file    Review of Systems:    Constitutional: No weight loss, fever or chills Skin: No rash Cardiovascular: No chest pain Respiratory: No SOB Gastrointestinal: See HPI and otherwise negative Genitourinary: No dysuria Neurological: No headache, dizziness or syncope Musculoskeletal: No new muscle or joint pain Hematologic: No bleeding  Psychiatric: +anxiety   Physical Exam:  Vital signs: BP 110/70   Pulse 72   Ht 5' 8"  (1.727 m)   Wt 163 lb (73.9 kg)   BMI 24.78 kg/m   Constitutional:   Pleasant Caucasian female appears to be in NAD,  Well developed, Well nourished, alert and cooperative Head:  Normocephalic and atraumatic. Eyes:   PEERL, EOMI. No icterus. Conjunctiva pink. Ears:  Normal auditory acuity. Neck:  Supple Throat: Oral cavity and pharynx without inflammation, swelling or lesion.  Respiratory: Respirations even and unlabored. Lungs clear to auscultation bilaterally.   No wheezes, crackles, or rhonchi.  Cardiovascular: Normal S1, S2. No MRG. Regular rate and rhythm. No peripheral edema, cyanosis or pallor.  Gastrointestinal:  Soft, nondistended, nontender. No rebound or guarding. Normal bowel sounds. No appreciable masses or hepatomegaly. Rectal:  Not performed.  Msk:  Symmetrical without gross deformities. Without edema, no deformity or joint abnormality.  Neurologic:   Alert and  oriented x4;  grossly normal neurologically.  Skin:   Dry and intact without significant lesions or rashes. Psychiatric: Demonstrates good judgement and reason without abnormal affect or behaviors.  No recent labs/imaging.  Assessment: 1.  Dyspepsia: Episodes of waking up having to vomit, often worse with overeating in the evening; likely gastritis+ reflux 2.  Dysphagia: Feels like food sometimes gets stuck on the way down, no regurgitation; likely stricture 3.  GERD  Plan: 1.  Scheduled patient for EGD with dilation with Dr. Ardis Hughs in the [facility].  Patient has had her COVID vaccines.  Did provide the patient with a detailed list of risks for the procedure and she agrees to proceed. 2.  Encouraged the patient to use her Omeprazole 20 mg daily, 30-60 minutes before breakfast in the morning.  Patient tells me she has enough of this medication at home as she was prescribed from the urgent care recently. 3.  Patient to follow in clinic per recommendations from Dr. Ardis Hughs after time of procedure.  Ellouise Newer, PA-C [facility] 04/14/2020, 3:09 PM  Cc: Delilah Shan, MD

## 2020-04-14 NOTE — Patient Instructions (Signed)
If you are age 46 or older, your body mass index should be between 23-30. Your Body mass index is 24.78 kg/m. If this is out of the aforementioned range listed, please consider follow up with your Primary Care Provider.  If you are age 66 or younger, your body mass index should be between 19-25. Your Body mass index is 24.78 kg/m. If this is out of the aformentioned range listed, please consider follow up with your Primary Care Provider.   You have been scheduled for an endoscopy. Please follow written instructions given to you at your visit today. If you use inhalers (even only as needed), please bring them with you on the day of your procedure.  Thank you for choosing me and [facility].  Ellouise Newer, PA-C

## 2020-04-15 NOTE — Progress Notes (Signed)
I agree with the above note, plan 

## 2020-05-26 ENCOUNTER — Other Ambulatory Visit: Payer: Self-pay

## 2020-05-26 ENCOUNTER — Ambulatory Visit (AMBULATORY_SURGERY_CENTER): Payer: Managed Care, Other (non HMO) | Admitting: Gastroenterology

## 2020-05-26 ENCOUNTER — Encounter: Payer: Self-pay | Admitting: Gastroenterology

## 2020-05-26 VITALS — BP 106/63 | HR 66 | Temp 97.7°F | Resp 11 | Ht 68.0 in | Wt 163.0 lb

## 2020-05-26 DIAGNOSIS — K2950 Unspecified chronic gastritis without bleeding: Secondary | ICD-10-CM | POA: Diagnosis not present

## 2020-05-26 DIAGNOSIS — R1314 Dysphagia, pharyngoesophageal phase: Secondary | ICD-10-CM | POA: Diagnosis not present

## 2020-05-26 DIAGNOSIS — K2970 Gastritis, unspecified, without bleeding: Secondary | ICD-10-CM

## 2020-05-26 MED ORDER — SODIUM CHLORIDE 0.9 % IV SOLN: 500.0000 mL | Freq: Once | INTRAVENOUS | Status: DC

## 2020-05-26 MED ORDER — PANTOPRAZOLE SODIUM 40 MG PO TBEC: 40.0000 mg | DELAYED_RELEASE_TABLET | Freq: Every day | ORAL | 4 refills | Status: DC

## 2020-05-26 NOTE — Progress Notes (Signed)
Called to room to assist during endoscopic procedure.  Patient ID and intended procedure confirmed with present staff. Received instructions for my participation in the procedure from the performing physician.  

## 2020-05-26 NOTE — Patient Instructions (Signed)
Await pathology  Please read over handout about gastritis  Continue your normal medications  Pantoprazole 40 mg-1 pill 20 to 30 minutes before breakfast daily- sent to [facility] on [address]:   Refer to the procedure report that was given to you for any specific questions about what was found during the examination.  If the procedure report does not answer your questions, please call your gastroenterologist to clarify.  If you requested that your care partner not be given the details of your procedure findings, then the procedure report has been included in a sealed envelope for you to review at your convenience later.  YOU SHOULD EXPECT: Some feelings of bloating in the abdomen. Passage of more gas than usual.  Walking can help get rid of the air that was put into your GI tract during the procedure and reduce the bloating.  Please Note:  You might notice some irritation and congestion in your nose or some drainage.  This is from the oxygen used during your procedure.  There is no need for concern and it should clear up in a day or so.  SYMPTOMS TO REPORT IMMEDIATELY:   Following upper endoscopy (EGD)  Vomiting of blood or coffee ground material  New chest pain or pain under the shoulder blades  Painful or persistently difficult swallowing  New shortness of breath  Fever of 100F or higher  Black, tarry-looking stools  For urgent or emergent issues, a gastroenterologist can be reached at any hour by calling [phone]. Do not use MyChart messaging for urgent concerns.    DIET:  We do recommend a small meal at first, but then you may proceed to your regular diet.  Drink plenty of fluids but you should avoid alcoholic beverages for 24 hours.  ACTIVITY:  You should plan to take it easy for the rest of today and you should NOT DRIVE or use heavy machinery until tomorrow (because of the sedation medicines  used during the test).    FOLLOW UP: Our staff will call the number listed on your records 48-72 hours following your procedure to check on you and address any questions or concerns that you may have regarding the information given to you following your procedure. If we do not reach you, we will leave a message.  We will attempt to reach you two times.  During this call, we will ask if you have developed any symptoms of COVID 19. If you develop any symptoms (ie: fever, flu-like symptoms, shortness of breath, cough etc.) before then, please call [phone].  If you test positive for Covid 19 in the 2 weeks post procedure, please call and report this information to [place].    If any biopsies were taken you will be contacted by phone or by letter within the next 1-3 weeks.  Please call us at [phone] if you have not heard about the biopsies in 3 weeks.    SIGNATURES/CONFIDENTIALITY: You and/or your care partner have signed paperwork which will be entered into your electronic medical record.  These signatures attest to the fact that that the information above on your After Visit Summary has been reviewed and is understood.  Full responsibility of the confidentiality of this discharge information lies with you and/or your care-partner.

## 2020-05-26 NOTE — Progress Notes (Signed)
To PACU, VSS. Report to [email or web address] 

## 2020-05-26 NOTE — Progress Notes (Signed)
C.w. VITAL SIGNS.

## 2020-05-26 NOTE — Op Note (Addendum)
[facility] Patient Name: Lacey Hernandez Procedure Date: 05/26/2020 3:37 PM MRN: [id number] Endoscopist: Milus Banister , MD Age: 45 Referring MD:  Date of Birth: 28-Jul-1975 Gender: Female Account #: [id number] Procedure:                Upper GI endoscopy Indications:              Dysphagia, Heartburn Medicines:                Monitored Anesthesia Care Procedure:                Pre-Anesthesia Assessment:                           - Prior to the procedure, a History and Physical                            was performed, and patient medications and                            allergies were reviewed. The patient's tolerance of                            previous anesthesia was also reviewed. The risks                            and benefits of the procedure and the sedation                            options and risks were discussed with the patient.                            All questions were answered, and informed consent                            was obtained. Prior Anticoagulants: The patient has                            taken no previous anticoagulant or antiplatelet                            agents. ASA Grade Assessment: II - A patient with                            mild systemic disease. After reviewing the risks                            and benefits, the patient was deemed in                            satisfactory condition to undergo the procedure.                           After obtaining informed consent, the endoscope was  passed under direct vision. Throughout the                            procedure, the patient's blood pressure, pulse, and                            oxygen saturations were monitored continuously. The                            Endoscope was introduced through the mouth, and                            advanced to the second part of duodenum. The upper                            GI endoscopy was accomplished  without difficulty.                            The patient tolerated the procedure well. Scope In: Scope Out: Findings:                 Mild inflammation characterized by erythema and                            friability was found in the gastric antrum.                            Biopsies were taken with a cold forceps for                            histology.                           The exam was otherwise without abnormality. Complications:            No immediate complications. Estimated blood loss:                            None. Estimated Blood Loss:     Estimated blood loss: none. Impression:               - Mild, non-specific gastritis. Biopsies taken.                           - The examination was otherwise normal. Recommendation:           - Patient has a contact number available for                            emergencies. The signs and symptoms of potential                            delayed complications were discussed with the                            patient. Return to normal activities tomorrow.  Written discharge instructions were provided to the                            patient.                           - Resume previous diet.                           - New prescription called in; pantoprazole 37m                            pills, one pill 20-30 min before BF meal daily,                            disp 30 with 4 refills.                           - Await pathology results. DMilus Banister MD 05/26/2020 3:55:40 PM This report has been signed electronically.

## 2020-05-26 NOTE — Progress Notes (Signed)
Pt's states no medical or surgical changes since previsit or office visit. 

## 2020-05-28 ENCOUNTER — Telehealth: Payer: Self-pay | Admitting: *Deleted

## 2020-05-28 ENCOUNTER — Telehealth: Payer: Self-pay

## 2020-05-28 NOTE — Telephone Encounter (Signed)
Left message on follow up call. 

## 2020-05-28 NOTE — Telephone Encounter (Signed)
  Follow up Call-  Call back number 05/26/2020  Post procedure Call Back phone  # [phone]  Permission to leave phone message Yes  Some recent data might be hidden     Patient questions:  Do you have a fever, pain , or abdominal swelling? No. Pain Score  0 *  Have you tolerated food without any problems? Yes.    Have you been able to return to your normal activities? Yes.    Do you have any questions about your discharge instructions: Diet   No. Medications  No. Follow up visit  No.  Do you have questions or concerns about your Care? No.  Actions: * If pain score is 4 or above: No action needed, pain <4.  1. Have you developed a fever since your procedure? no  2.   Have you had an respiratory symptoms (SOB or cough) since your procedure? no  3.   Have you tested positive for COVID 19 since your procedure no  4.   Have you had any family members/close contacts diagnosed with the COVID 19 since your procedure?  no   If yes to any of these questions please route to Joylene John, RN and Joella Prince, RN

## 2020-06-03 ENCOUNTER — Encounter: Payer: Self-pay | Admitting: Gastroenterology

## 2020-06-10 ENCOUNTER — Telehealth: Payer: Self-pay | Admitting: Gastroenterology

## 2020-06-10 NOTE — Telephone Encounter (Signed)
The patient has been notified of this information and all questions answered.    Dear Ms. Agostino,  The biopsies taken during your recent upper endoscopy showed no sign of infection, serious inflammation or cancer.  You should continue to follow the recommendations that we discussed at the time of your procedure.  If you have any questions or concerns, please don't hesitate to call.  Sincerely,    Milus Banister, MD

## 2020-06-10 NOTE — Telephone Encounter (Signed)
Pt is requesting a call back from a nurse regarding her pathology results.

## 2020-07-21 IMAGING — CT CT RENAL STONE PROTOCOL
2 of 4 series · 16 of 46 positions shown, 18 images · non-contrast
Comparison: None.

CLINICAL DATA: Left flank pain. Urinary frequency and retention for
2 days.

EXAM:
CT ABDOMEN AND PELVIS WITHOUT CONTRAST
TECHNIQUE: Multidetector CT imaging of the abdomen and pelvis was performed
following the standard protocol without IV contrast.

[Series 2: axial st · axial · 0.64mm/px · z∈[-475,-90]mm · 13 of 87 slices shown, 15 images]
[im 5/87  soft-tissue]
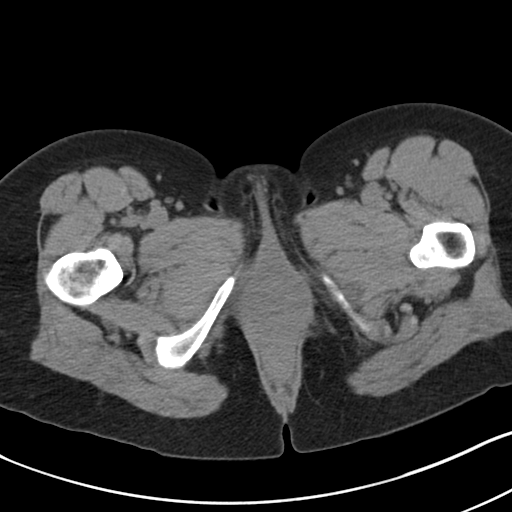
[im 5/87  bone]
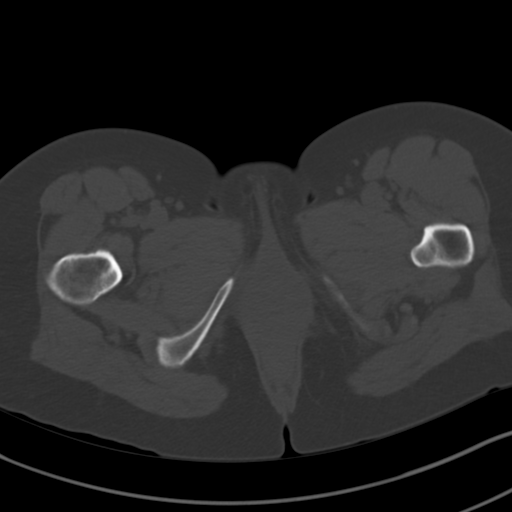
[im 10/87  soft-tissue]
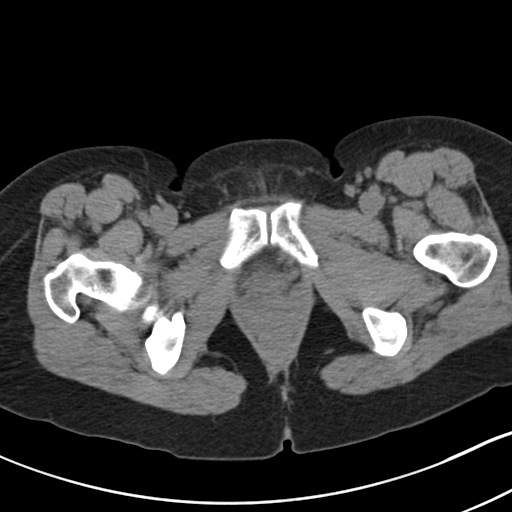
[im 20/87  soft-tissue]
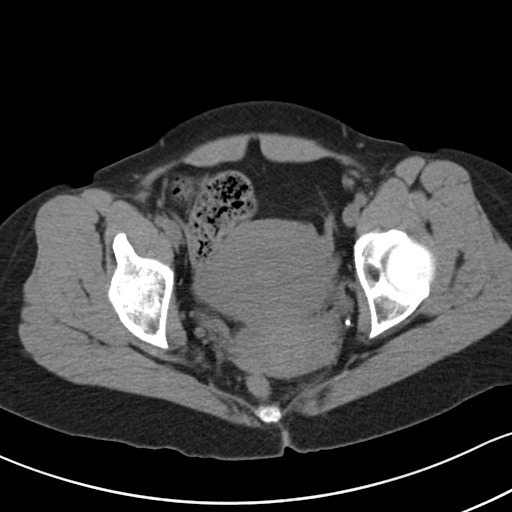
[im 24/87  soft-tissue]
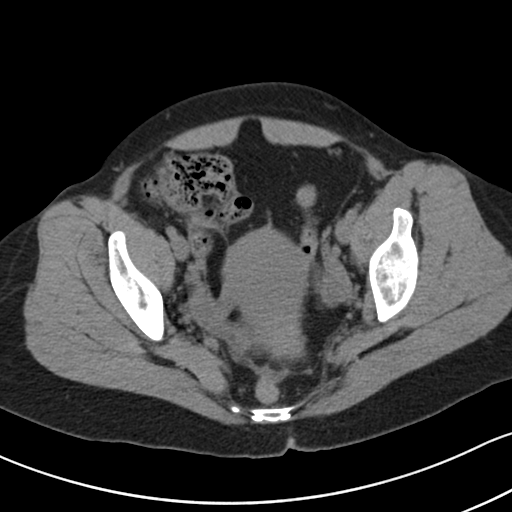
[im 29/87  soft-tissue]
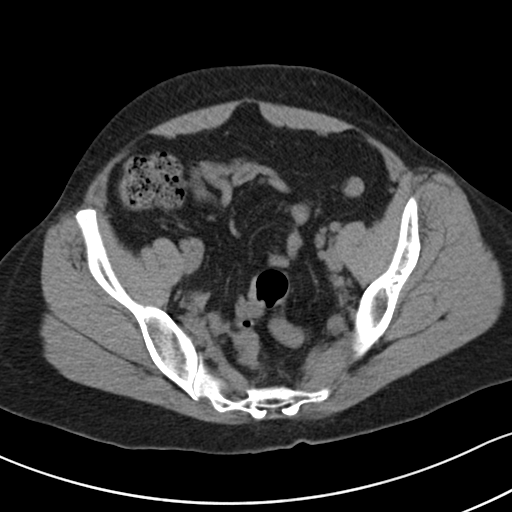
[im 39/87  soft-tissue]
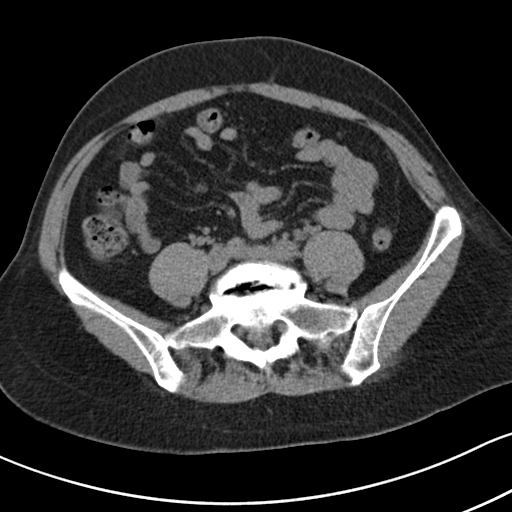
[im 44/87  soft-tissue]
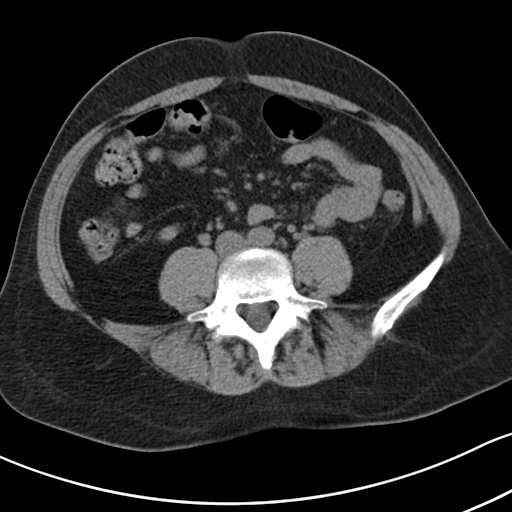
[im 48/87  soft-tissue]
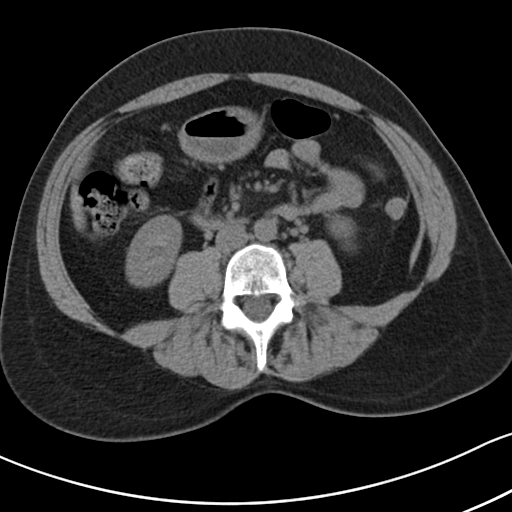
[im 58/87  soft-tissue]
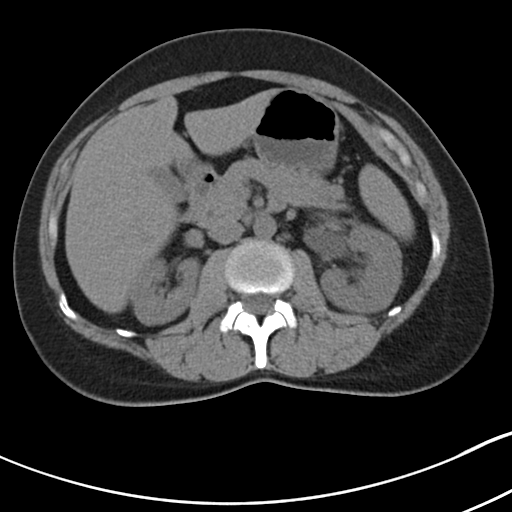
[im 58/87  bone]
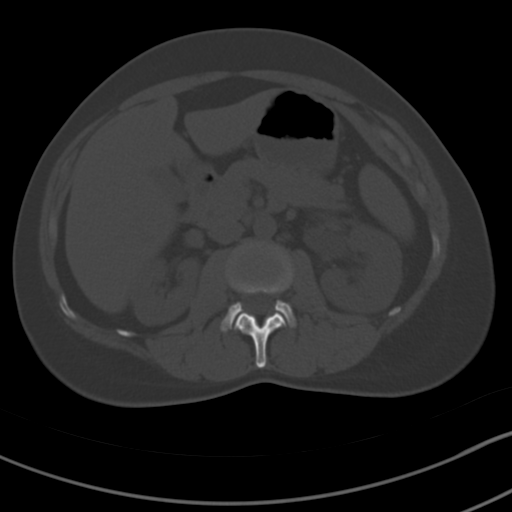
[im 63/87  soft-tissue]
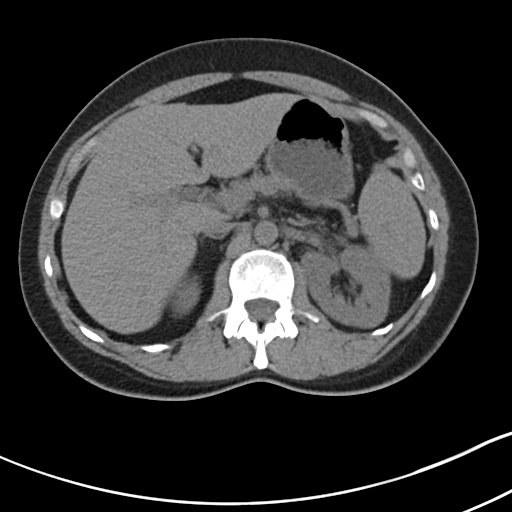
[im 67/87  soft-tissue]
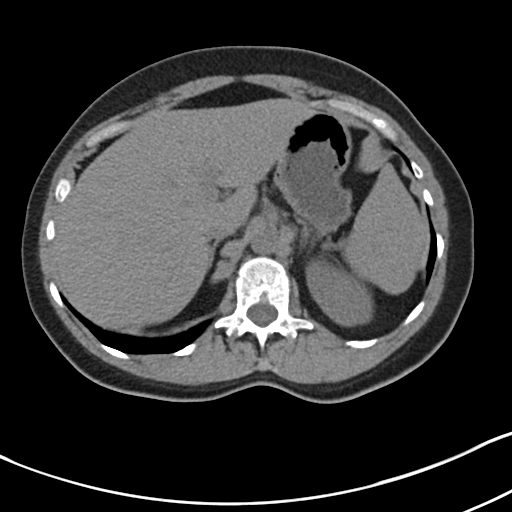
[im 77/87  soft-tissue]
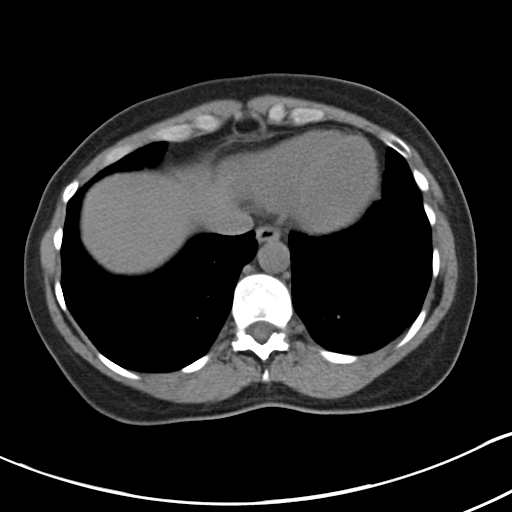
[im 82/87  soft-tissue]
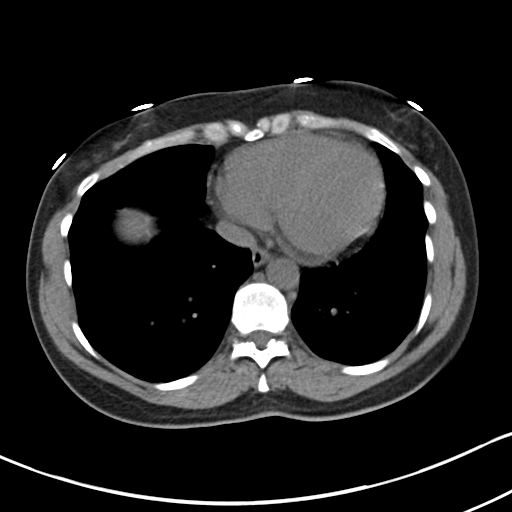

[Series 4: coronal · coronal · 0.79mm/px · 3 of 142 slices shown]
[im 48/142  soft-tissue]
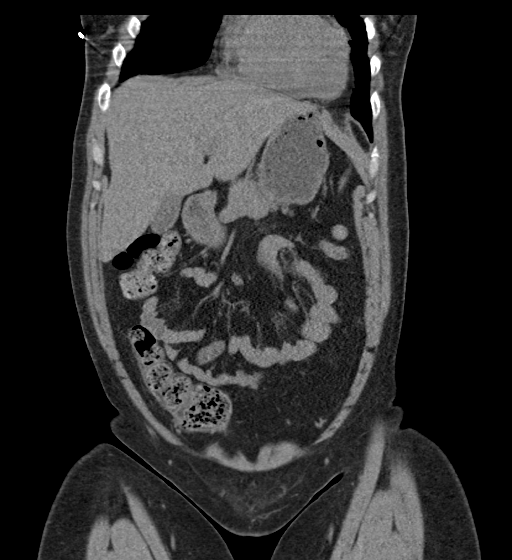
[im 63/142  soft-tissue]
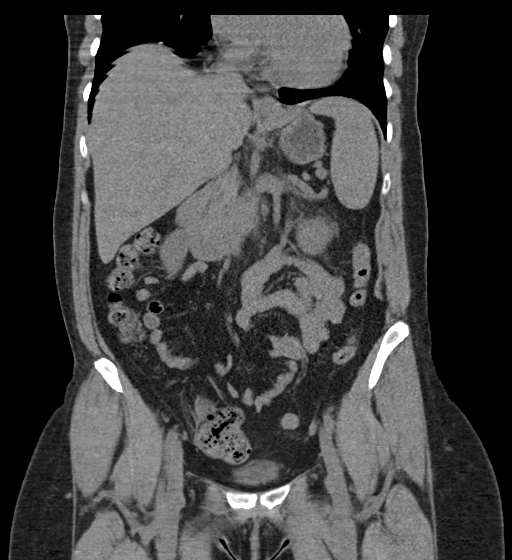
[im 79/142  soft-tissue]
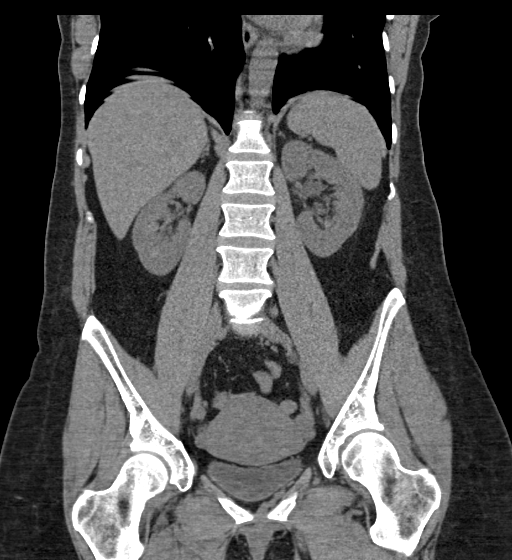

[16 of 46 positions shown; findings below may reference images not displayed]

FINDINGS: Lower chest: No significant pulmonary nodules or acute consolidative
airspace disease.

Hepatobiliary: Normal liver size. Simple 1.2 cm posterior right
liver lobe cyst. No additional liver lesions. Normal gallbladder
with no radiopaque cholelithiasis. No biliary ductal dilatation.

Pancreas: Normal, with no mass or duct dilation.

Spleen: Normal size. No mass.

Adrenals/Urinary Tract: Normal adrenals. There is mild fullness of
the central left renal collecting system without overt left
hydronephrosis. There is fat stranding surrounding central left
renal collecting system. No left renal stones. Punctate
nonobstructing upper right renal stone. No right hydronephrosis.
Ureters are normal caliber, with no ureteral stones. No contour
deforming renal mass. Normal bladder.

Stomach/Bowel: Normal non-distended stomach. Normal caliber small
bowel with no small bowel wall thickening. Normal appendix. Normal
large bowel with no diverticulosis, large bowel wall thickening or
pericolonic fat stranding.

Vascular/Lymphatic: Normal caliber abdominal aorta. No
pathologically enlarged lymph nodes in the abdomen or pelvis.

Reproductive: Grossly normal uterus.  No adnexal mass.

Other: No pneumoperitoneum, ascites or focal fluid collection. Small
fat containing periumbilical hernia.

Musculoskeletal: No aggressive appearing focal osseous lesions.
Moderate degenerative disc disease at L5-S1.
IMPRESSION: 1. Mild fullness of the central left renal collecting system without
overt left hydronephrosis. Fat stranding surrounding the central
left renal collecting system. No stones in the left kidney or left
ureter. These findings are nonspecific and could be due to recent
passage of a left urinary tract stone. An ascending left urinary
tract infection is not entirely excluded.
2. Punctate nonobstructing right renal stone.

## 2020-08-21 ENCOUNTER — Other Ambulatory Visit: Payer: Self-pay | Admitting: Gastroenterology

## 2020-08-21 DIAGNOSIS — R1314 Dysphagia, pharyngoesophageal phase: Secondary | ICD-10-CM

## 2021-02-22 DIAGNOSIS — F4323 Adjustment disorder with mixed anxiety and depressed mood: Secondary | ICD-10-CM | POA: Diagnosis not present

## 2021-02-23 ENCOUNTER — Emergency Department (HOSPITAL_COMMUNITY): Payer: BC Managed Care – PPO

## 2021-02-23 ENCOUNTER — Other Ambulatory Visit: Payer: Self-pay

## 2021-02-23 ENCOUNTER — Emergency Department (HOSPITAL_COMMUNITY)
Admission: EM | Admit: 2021-02-23 | Discharge: 2021-02-23 | Disposition: A | Discharge status: Home / Self Care | Payer: BC Managed Care – PPO | Attending: Emergency Medicine | Admitting: Emergency Medicine

## 2021-02-23 ENCOUNTER — Encounter (HOSPITAL_COMMUNITY): Payer: Self-pay | Admitting: Emergency Medicine

## 2021-02-23 DIAGNOSIS — R0789 Other chest pain: Secondary | ICD-10-CM | POA: Diagnosis not present

## 2021-02-23 DIAGNOSIS — R079 Chest pain, unspecified: Secondary | ICD-10-CM | POA: Diagnosis not present

## 2021-02-23 DIAGNOSIS — R110 Nausea: Secondary | ICD-10-CM | POA: Insufficient documentation

## 2021-02-23 DIAGNOSIS — R42 Dizziness and giddiness: Secondary | ICD-10-CM | POA: Insufficient documentation

## 2021-02-23 DIAGNOSIS — L658 Other specified nonscarring hair loss: Secondary | ICD-10-CM | POA: Diagnosis not present

## 2021-02-23 DIAGNOSIS — R61 Generalized hyperhidrosis: Secondary | ICD-10-CM | POA: Diagnosis not present

## 2021-02-23 LAB — BASIC METABOLIC PANEL
Anion gap: 8 (ref 5–15)
BUN: 9 mg/dL (ref 6–20)
CO2: 15 mmol/L — ABNORMAL LOW (ref 22–32)
Calcium: 9.1 mg/dL (ref 8.9–10.3)
Chloride: 114 mmol/L — ABNORMAL HIGH (ref 98–111)
Creatinine, Ser: 0.79 mg/dL (ref 0.44–1.00)
GFR, Estimated: >60 mL/min (ref >60)
Glucose, Bld: 123 mg/dL — ABNORMAL HIGH (ref 70–99)
Potassium: 3.9 mmol/L (ref 3.5–5.1)
Sodium: 137 mmol/L (ref 135–145)

## 2021-02-23 LAB — CBC
HCT: 40.8 % (ref 36.0–46.0)
Hemoglobin: 12.9 g/dL (ref 12.0–15.0)
MCH: 27.6 pg (ref 26.0–34.0)
MCHC: 31.6 g/dL (ref 30.0–36.0)
MCV: 87.2 fL (ref 80.0–100.0)
Platelets: 328 10*3/uL (ref 150–400)
RBC: 4.68 MIL/uL (ref 3.87–5.11)
RDW: 15.3 % (ref 11.5–15.5)
WBC: 7.5 10*3/uL (ref 4.0–10.5)
nRBC: 0 % (ref 0.0–0.2)

## 2021-02-23 LAB — TROPONIN I (HIGH SENSITIVITY): Troponin I (High Sensitivity): <2 ng/L (ref <18)

## 2021-02-23 LAB — I-STAT BETA HCG BLOOD, ED (MC, WL, AP ONLY): I-stat hCG, quantitative: <5 m[IU]/mL (ref <5)

## 2021-02-23 NOTE — Discharge Instructions (Signed)
You have been evaluated for your chest pain.  Fortunately no concerning finding were noted during this exam.  Please follow-up closely with your primary care doctor for further assessment.

## 2021-02-23 NOTE — ED Triage Notes (Signed)
For the past two weeks pt has been having intermittent  L sided dull chest pain that radiates down to her L arm and L neck/shoulder area, reports mostly at night laying down, has felt the CP during the day now.

## 2021-02-23 NOTE — ED Provider Notes (Signed)
[facility] DEPT Provider Note   CSN: [id number] Arrival date & time: 02/23/21  1139     History Chief Complaint  Patient presents with   Chest Pain    Lacey Hernandez is a 45 y.o. female.  The history is provided by the patient. No language interpreter was used.  Chest Pain  45 year old female significant history of anxiety, insomnia, no history of alcohol or tobacco use who presents for evaluation of chest pain.  Patient reports 2 weeks ago she noticed pain in her chest.  She described pain as a dull sensation in her left chest that started while she was laying in bed.  At that time she report she felt dizzy and diaphoretic.  She did contact EMS and when they arrived, an EKG obtained and she was told that it was normal.  Since then she has had intermittent pain in her chest.  For the past 2 days pain is more noticeable.  She described pain now as a pressure sensation across her chest with tingling and weakness sensation down her left arm.  No associated lightheadedness dizziness diaphoresis nausea.  She admits that she has been feeling more stressed out recently.  She does endorse family history of cardiac disease.  She does not smoke or drink and denies any other cardiac history.  No prior history of PE or DVT no recent surgery prolonged bedrest active cancer taking oral birth control having leg swelling or calf pain.  She is not having active chest pain at this time.  She does not have a PCP at this time because her previous doctor had retired.  Past Medical History:  Diagnosis Date   Anxiety    Insomnia     There are no problems to display for this patient.   History reviewed. No pertinent surgical history.   OB History   No obstetric history on file.     Family History  Problem Relation Age of Onset   Stroke Maternal Grandmother    Heart disease Maternal Grandfather     Social History   Tobacco Use   Smoking status: Never   Smokeless  tobacco: Never  Vaping Use   Vaping Use: Never used  Substance Use Topics   Alcohol use: No   Drug use: No    Home Medications Prior to Admission medications   Medication Sig Start Date End Date Taking? Authorizing Provider  ALPRAZolam Duanne Moron) 0.5 MG tablet Take 0.5 mg by mouth at bedtime. 10/02/17   [provider]  mirtazapine (REMERON) 15 MG tablet Take 15 mg by mouth at bedtime.    [provider]  pantoprazole (PROTONIX) 40 MG tablet TAKE 1 TABLET BY MOUTH 20-30 MINUTES BEFORE BREAKFAST DAILY 08/23/20   Milus Banister, MD    Allergies    Patient has no known allergies.  Review of Systems   Review of Systems  Cardiovascular:  Positive for chest pain.  All other systems reviewed and are negative.  Physical Exam Updated Vital Signs BP 124/78 (BP Location: Left Arm)    Pulse 88    Temp 97.6 F (36.4 C) (Oral)    Resp 16    Ht 5' 7"  (1.702 m)    Wt 72.6 kg    LMP 02/07/2021    SpO2 100%    BMI 25.06 kg/m   Physical Exam Vitals and nursing note reviewed.  Constitutional:      General: She is not in acute distress.    Appearance: She is  well-developed.     Comments: Patient appears anxious  HENT:     Head: Atraumatic.  Eyes:     Conjunctiva/sclera: Conjunctivae normal.  Cardiovascular:     Rate and Rhythm: Normal rate and regular rhythm.     Pulses: Normal pulses.     Heart sounds: Normal heart sounds.  Pulmonary:     Effort: Pulmonary effort is normal.     Breath sounds: Normal breath sounds. No wheezing, rhonchi or rales.  Chest:     Chest wall: Tenderness (Tenderness anterior chest on palpation but no overlying skin changes) present.  Abdominal:     Palpations: Abdomen is soft.     Tenderness: There is no abdominal tenderness.  Musculoskeletal:     Cervical back: Neck supple.     Right lower leg: No edema.     Left lower leg: No edema.  Skin:    Capillary Refill: Capillary refill takes less than 2 seconds.     Findings: No rash.   Neurological:     Mental Status: She is alert and oriented to person, place, and time.  Psychiatric:        Mood and Affect: Mood normal.    ED Results / Procedures / Treatments   Labs (all labs ordered are listed, but only abnormal results are displayed) Labs Reviewed  BASIC METABOLIC PANEL - Abnormal; Notable for the following components:      Result Value   Chloride 114 (*)    CO2 15 (*)    Glucose, Bld 123 (*)    All other components within normal limits  CBC  I-STAT BETA HCG BLOOD, ED ([facility], [facility], [facility] ONLY)  TROPONIN I (HIGH SENSITIVITY)  TROPONIN I (HIGH SENSITIVITY)    EKG None ED ECG REPORT   Date: 02/23/2021  Rate: 90  Rhythm: normal sinus rhythm  QRS Axis: normal  Intervals: normal  ST/T Wave abnormalities: normal  Conduction Disutrbances:none  Narrative Interpretation:   Old EKG Reviewed: unchanged  I have personally reviewed the EKG tracing and agree with the computerized printout as noted.   Radiology DG Chest 2 View  Result Date: 02/23/2021 CLINICAL DATA:  Chest pain. EXAM: CHEST - 2 VIEW COMPARISON:  June 22, 2015. FINDINGS: No consolidation. No visible pleural effusions or pneumothorax. Cardiomediastinal silhouette is within normal limits and unchanged. No evidence of acute osseous abnormality. IMPRESSION: No evidence of acute cardiopulmonary disease. Electronically Signed   By: Margaretha Sheffield M.D.   On: 02/23/2021 12:28    Procedures Procedures   Medications Ordered in ED Medications - No data to display  ED Course  I have reviewed the triage vital signs and the nursing notes.  Pertinent labs & imaging results that were available during my care of the patient were reviewed by me and considered in my medical decision making (see chart for details).    MDM Rules/Calculators/A&P                         BP 124/78 (BP Location: Left Arm)    Pulse 88    Temp 97.6 F (36.4 C) (Oral)    Resp 16    Ht 5' 7"  (1.702 m)    Wt 72.6 kg    LMP 02/07/2021     SpO2 100%    BMI 25.06 kg/m      Final Clinical Impression(s) / ED Diagnoses Final diagnoses:  Atypical chest pain    Rx / DC Orders ED Discharge Orders  None      1:12 PM Patient here complaining of chest pain.  She does not have any significant cardiac risk factor.  Pain is likely atypical of ACS.  Patient appears to be very nervous however she is PERC negative, low suspicion for PE.  2:36 PM HEAR score of 2, low risk of ACS.  Recommend outpt f/u with PCP for further care.  Return precaution given.    Domenic Moras, PA-C 02/23/21 [place], MD 03/02/21 [phone]

## 2021-02-23 NOTE — ED Notes (Signed)
Patient refused second troponin blood draw.  PA made aware.

## 2021-02-27 DIAGNOSIS — R079 Chest pain, unspecified: Secondary | ICD-10-CM | POA: Diagnosis not present

## 2021-02-27 DIAGNOSIS — R0789 Other chest pain: Secondary | ICD-10-CM | POA: Diagnosis not present

## 2021-02-27 DIAGNOSIS — R457 State of emotional shock and stress, unspecified: Secondary | ICD-10-CM | POA: Diagnosis not present

## 2021-03-02 DIAGNOSIS — R457 State of emotional shock and stress, unspecified: Secondary | ICD-10-CM | POA: Diagnosis not present

## 2021-03-02 DIAGNOSIS — F4323 Adjustment disorder with mixed anxiety and depressed mood: Secondary | ICD-10-CM | POA: Diagnosis not present

## 2021-03-02 DIAGNOSIS — R079 Chest pain, unspecified: Secondary | ICD-10-CM | POA: Diagnosis not present

## 2021-03-02 DIAGNOSIS — R0789 Other chest pain: Secondary | ICD-10-CM | POA: Diagnosis not present

## 2021-03-10 DIAGNOSIS — F4323 Adjustment disorder with mixed anxiety and depressed mood: Secondary | ICD-10-CM | POA: Diagnosis not present

## 2021-03-14 ENCOUNTER — Ambulatory Visit (INDEPENDENT_AMBULATORY_CARE_PROVIDER_SITE_OTHER): Payer: BC Managed Care – PPO | Admitting: Internal Medicine

## 2021-03-14 ENCOUNTER — Other Ambulatory Visit: Payer: Self-pay

## 2021-03-14 VITALS — BP 113/76 | HR 88 | Ht 67.0 in | Wt 156.6 lb

## 2021-03-14 DIAGNOSIS — R0789 Other chest pain: Secondary | ICD-10-CM | POA: Diagnosis not present

## 2021-03-14 NOTE — Progress Notes (Signed)
Cardiology Office Note:    Date:  03/14/2021   ID:  Lacey Hernandez, DOB 23-Dec-1975, MRN [id number]  PCP:  Pcp, No   [facility] Providers Cardiologist:  Janina Mayo, MD     Referring MD: No ref. provider found   No chief complaint on file. Atypical chest pain  History of Present Illness:    Lacey Hernandez is a 46 y.o. female with a hx of anxiety, insomnia who presented to [facility] for chest pain on 02/23/2021 referral for follow up   Per ED notes:  She described pain as a dull sensation in her left chest that started while she was laying in bed.  At that time she report she felt dizzy and diaphoretic.  She did contact EMS and when they arrived, an EKG obtained and she was told that it was normal.  Since then she has had intermittent pain in her chest.  For the past 2 days pain is more noticeable.  She described pain now as a pressure sensation across her chest with tingling and weakness sensation down her left arm.  No associated lightheadedness dizziness diaphoresis nausea.  She admits that she has been feeling more stressed out recently.  She does endorse family history of cardiac disease.  She does not smoke or drink and denies any other cardiac history.  No prior history of PE or DVT no recent surgery prolonged bedrest active cancer taking oral birth control having leg swelling or calf pain.  She is not having active chest pain at this time. Her EKG was non ischemic. Troponin was negative x1. Patient didn't want to repeat it.   Today she reports,  for the last 4 weeks she's had chest pains on the left side of her chest. She would get left arm radiation, or her back or neck. Prior to her ED visit, she noted chest pain waking her up and  felt her face go numb. She is concerned that this may be a panic attack. She's had some new changes. She notes it sporadically. She said earlier she had a pain that last 2 seconds. She denies chest pain with exertion. She feels panicked. She says xanax helps.  She has a therapist. She has no hx of heart disease. Her mother has hx of strokes in her 66s. Her aunt needed a stent in her 27s. Non smoker. No other drug use. No hx of cardiac stress or echo. Her blood pressure is good.    Past Medical History:  Diagnosis Date   Anxiety    Insomnia     No past surgical history on file.  Current Medications: No outpatient medications have been marked as taking for the 03/14/21 encounter (Office Visit) with Janina Mayo, MD.     Allergies:   Patient has no known allergies.   Social History   Socioeconomic History   Marital status: Single    Spouse name: Not on file   Number of children: Not on file   Years of education: Not on file   Highest education level: Not on file  Occupational History   Occupation: Teacher   Tobacco Use   Smoking status: Never   Smokeless tobacco: Never  Vaping Use   Vaping Use: Never used  Substance and Sexual Activity   Alcohol use: No   Drug use: No   Sexual activity: Yes  Other Topics Concern   Not on file  Social History Narrative   Not on file   Social Determinants of Health  Financial Resource Strain: Not on file  Food Insecurity: Not on file  Transportation Needs: Not on file  Physical Activity: Not on file  Stress: Not on file  Social Connections: Not on file     Family History: The patient's family history includes Heart disease in her maternal grandfather; Stroke in her maternal grandmother.  ROS:   Please see the history of present illness.     All other systems reviewed and are negative.  EKGs/Labs/Other Studies Reviewed:    The following studies were reviewed today:   EKG:  EKG is  ordered today.  The ekg ordered today demonstrates   NSR, non specific ST-T changes  Recent Labs: 02/23/2021: BUN 9; Creatinine, Ser 0.79; Hemoglobin 12.9; Platelets 328; Potassium 3.9; Sodium 137  Recent Lipid Panel No results found for: CHOL, TRIG, HDL, CHOLHDL, VLDL, LDLCALC,  LDLDIRECT   Risk Assessment/Calculations:           Physical Exam:    VS:  BP 113/76    Pulse 88    Ht 5' 7"  (1.702 m)    Wt 156 lb 9.6 oz (71 kg)    SpO2 99%    BMI 24.53 kg/m     Wt Readings from Last 3 Encounters:  03/14/21 156 lb 9.6 oz (71 kg)  02/23/21 160 lb (72.6 kg)  05/26/20 163 lb (73.9 kg)    Vitals:   03/14/21 0905  BP: 113/76  Pulse: 88  SpO2: 99%    GEN:  Well nourished, well developed in no acute distress HEENT: Normal NECK: No JVD; No carotid bruits LYMPHATICS: No lymphadenopathy CARDIAC: RRR, no murmurs, rubs, gallops RESPIRATORY:  Clear to auscultation without rales, wheezing or rhonchi  ABDOMEN: Soft, non-tender, non-distended MUSCULOSKELETAL:  No edema; No deformity  SKIN: Warm and dry NEUROLOGIC:  Alert and oriented x 3 PSYCHIATRIC: Anxious  ASSESSMENT:    # Atypical CP: suspect her chest pain is related to anxiety. Her cardiac risk is very low. She has no ischemic changes on EKG. Trop was negative in the ED. We discussed that if she has chest pain with exertion or if her symptoms change in nature, she can let us know and can plan for further work up.  PLAN:    In order of problems listed above:  Follow up as needed         Medication Adjustments/Labs and Tests Ordered: Current medicines are reviewed at length with the patient today.  Concerns regarding medicines are outlined above.  Orders Placed This Encounter  Procedures   EKG 12-Lead   No orders of the defined types were placed in this encounter.   Patient Instructions  Medication Instructions:  No Changes In Medications at this time. *If you need a refill on your cardiac medications before your next appointment, please call your pharmacy*  Follow-Up: At [facility], you and your health needs are our priority.  As part of our continuing mission to provide you with exceptional heart care, we have created designated Provider Care Teams.  These Care Teams include your primary  Cardiologist (physician) and Advanced Practice Providers (APPs -  Physician Assistants and Nurse Practitioners) who all work together to provide you with the care you need, when you need it.  We recommend signing up for the patient portal called "MyChart".  Sign up information is provided on this After Visit Summary.  MyChart is used to connect with patients for Virtual Visits (Telemedicine).  Patients are able to view lab/test results, encounter notes, upcoming appointments,  etc.  Non-urgent messages can be sent to your provider as well.   To learn more about what you can do with MyChart, go to [email or web address].    Your next appointment:   AS NEEDED   The format for your next appointment:   In Person  Provider:   Janina Mayo, MD      Signed, Janina Mayo, MD  03/14/2021 9:28 AM    [place]

## 2021-03-14 NOTE — Patient Instructions (Signed)
Medication Instructions:  No Changes In Medications at this time.  *If you need a refill on your cardiac medications before your next appointment, please call your pharmacy*  Follow-Up: At [facility], you and your health needs are our priority.  As part of our continuing mission to provide you with exceptional heart care, we have created designated Provider Care Teams.  These Care Teams include your primary Cardiologist (physician) and Advanced Practice Providers (APPs -  Physician Assistants and Nurse Practitioners) who all work together to provide you with the care you need, when you need it.  We recommend signing up for the patient portal called "MyChart".  Sign up information is provided on this After Visit Summary.  MyChart is used to connect with patients for Virtual Visits (Telemedicine).  Patients are able to view lab/test results, encounter notes, upcoming appointments, etc.  Non-urgent messages can be sent to your provider as well.   To learn more about what you can do with MyChart, go to [email or web address].    Your next appointment:   AS NEEDED   The format for your next appointment:   In Person  Provider:   Branch, Mary E, MD   

## 2021-03-29 DIAGNOSIS — F4323 Adjustment disorder with mixed anxiety and depressed mood: Secondary | ICD-10-CM | POA: Diagnosis not present

## 2021-04-11 DIAGNOSIS — F4323 Adjustment disorder with mixed anxiety and depressed mood: Secondary | ICD-10-CM | POA: Diagnosis not present

## 2021-04-20 DIAGNOSIS — Z13220 Encounter for screening for lipoid disorders: Secondary | ICD-10-CM | POA: Diagnosis not present

## 2021-04-20 DIAGNOSIS — Z833 Family history of diabetes mellitus: Secondary | ICD-10-CM | POA: Diagnosis not present

## 2021-04-20 DIAGNOSIS — F411 Generalized anxiety disorder: Secondary | ICD-10-CM | POA: Diagnosis not present

## 2021-04-20 DIAGNOSIS — F3341 Major depressive disorder, recurrent, in partial remission: Secondary | ICD-10-CM | POA: Diagnosis not present

## 2021-04-20 DIAGNOSIS — Z0000 Encounter for general adult medical examination without abnormal findings: Secondary | ICD-10-CM | POA: Diagnosis not present

## 2021-04-27 DIAGNOSIS — F411 Generalized anxiety disorder: Secondary | ICD-10-CM | POA: Diagnosis not present

## 2021-04-27 DIAGNOSIS — F341 Dysthymic disorder: Secondary | ICD-10-CM | POA: Diagnosis not present

## 2021-04-27 DIAGNOSIS — F339 Major depressive disorder, recurrent, unspecified: Secondary | ICD-10-CM | POA: Diagnosis not present

## 2021-05-07 DIAGNOSIS — Z1211 Encounter for screening for malignant neoplasm of colon: Secondary | ICD-10-CM | POA: Diagnosis not present

## 2021-06-15 DIAGNOSIS — F411 Generalized anxiety disorder: Secondary | ICD-10-CM | POA: Diagnosis not present

## 2021-06-15 DIAGNOSIS — F339 Major depressive disorder, recurrent, unspecified: Secondary | ICD-10-CM | POA: Diagnosis not present

## 2021-06-15 DIAGNOSIS — F341 Dysthymic disorder: Secondary | ICD-10-CM | POA: Diagnosis not present

## 2021-08-03 DIAGNOSIS — F341 Dysthymic disorder: Secondary | ICD-10-CM | POA: Diagnosis not present

## 2021-08-03 DIAGNOSIS — F411 Generalized anxiety disorder: Secondary | ICD-10-CM | POA: Diagnosis not present

## 2021-11-07 DIAGNOSIS — F411 Generalized anxiety disorder: Secondary | ICD-10-CM | POA: Diagnosis not present

## 2021-11-07 DIAGNOSIS — F341 Dysthymic disorder: Secondary | ICD-10-CM | POA: Diagnosis not present

## 2021-11-24 DIAGNOSIS — R5383 Other fatigue: Secondary | ICD-10-CM | POA: Diagnosis not present

## 2021-11-24 DIAGNOSIS — Z01419 Encounter for gynecological examination (general) (routine) without abnormal findings: Secondary | ICD-10-CM | POA: Diagnosis not present

## 2021-11-24 DIAGNOSIS — Z1231 Encounter for screening mammogram for malignant neoplasm of breast: Secondary | ICD-10-CM | POA: Diagnosis not present

## 2021-11-24 DIAGNOSIS — N939 Abnormal uterine and vaginal bleeding, unspecified: Secondary | ICD-10-CM | POA: Diagnosis not present

## 2021-11-24 DIAGNOSIS — Z124 Encounter for screening for malignant neoplasm of cervix: Secondary | ICD-10-CM | POA: Diagnosis not present

## 2021-12-26 ENCOUNTER — Encounter (HOSPITAL_COMMUNITY): Payer: Self-pay

## 2021-12-26 ENCOUNTER — Emergency Department (HOSPITAL_COMMUNITY): Payer: BC Managed Care – PPO

## 2021-12-26 ENCOUNTER — Emergency Department (HOSPITAL_COMMUNITY)
Admission: EM | Admit: 2021-12-26 | Discharge: 2021-12-27 | Discharge status: Against Medical Advice | Payer: BC Managed Care – PPO | Attending: Emergency Medicine | Admitting: Emergency Medicine

## 2021-12-26 DIAGNOSIS — R079 Chest pain, unspecified: Secondary | ICD-10-CM | POA: Insufficient documentation

## 2021-12-26 DIAGNOSIS — Z5321 Procedure and treatment not carried out due to patient leaving prior to being seen by health care provider: Secondary | ICD-10-CM | POA: Diagnosis not present

## 2021-12-26 LAB — COMPREHENSIVE METABOLIC PANEL
ALT: 12 U/L (ref 0–44)
AST: 14 U/L — ABNORMAL LOW (ref 15–41)
Albumin: 4.1 g/dL (ref 3.5–5.0)
Alkaline Phosphatase: 68 U/L (ref 38–126)
Anion gap: 9 (ref 5–15)
BUN: 9 mg/dL (ref 6–20)
CO2: 22 mmol/L (ref 22–32)
Calcium: 9.9 mg/dL (ref 8.9–10.3)
Chloride: 110 mmol/L (ref 98–111)
Creatinine, Ser: 0.72 mg/dL (ref 0.44–1.00)
GFR, Estimated: >60 mL/min (ref >60)
Glucose, Bld: 100 mg/dL — ABNORMAL HIGH (ref 70–99)
Potassium: 3.7 mmol/L (ref 3.5–5.1)
Sodium: 141 mmol/L (ref 135–145)
Total Bilirubin: 0.4 mg/dL (ref 0.3–1.2)
Total Protein: 7.1 g/dL (ref 6.5–8.1)

## 2021-12-26 LAB — I-STAT BETA HCG BLOOD, ED (MC, WL, AP ONLY): I-stat hCG, quantitative: <5 m[IU]/mL (ref <5)

## 2021-12-26 LAB — CBC
HCT: 38.2 % (ref 36.0–46.0)
Hemoglobin: 12.5 g/dL (ref 12.0–15.0)
MCH: 29.1 pg (ref 26.0–34.0)
MCHC: 32.7 g/dL (ref 30.0–36.0)
MCV: 88.8 fL (ref 80.0–100.0)
Platelets: 280 10*3/uL (ref 150–400)
RBC: 4.3 MIL/uL (ref 3.87–5.11)
RDW: 14.6 % (ref 11.5–15.5)
WBC: 8.1 10*3/uL (ref 4.0–10.5)
nRBC: 0 % (ref 0.0–0.2)

## 2021-12-26 LAB — TROPONIN I (HIGH SENSITIVITY): Troponin I (High Sensitivity): <2 ng/L (ref <18)

## 2021-12-26 MED ORDER — LIDOCAINE 5 % EX PTCH
1.0000 | MEDICATED_PATCH | CUTANEOUS | Status: DC
  Administered 2021-12-26: 1 via TRANSDERMAL
  Filled 2021-12-26: qty 1

## 2021-12-26 NOTE — ED Triage Notes (Signed)
Pt states that tonight around 6pm she began to have L sided CP that radiates to her L arm, denies other cardiac symptoms.

## 2021-12-26 NOTE — ED Provider Triage Note (Signed)
Emergency Medicine Provider Triage Evaluation Note  Lacey Hernandez , a 46 y.o. female  was evaluated in triage.  Pt complains of chest pain radiating to back and L arm, continous in nature, described as achyx3-4 hours. No N/V. Took 2 regular aspirins. No shortness of breath. No recent illnesses or trauma to chest. No cardiac history. Worse with exertion.   Review of Systems  Positive: Chest pain Negative: Shortness of breath  Physical Exam  BP 117/71   Pulse 67   Temp 98.2 F (36.8 C) (Oral)   Resp 20   SpO2 100%  Gen:   Awake, no distress   Resp:  Normal effort  MSK:   Moves extremities without difficulty  Other:  No chest wall ttp, or abdominal ttp  Medical Decision Making  Medically screening exam initiated at 9:32 PM.  Appropriate orders placed.  Lacey Hernandez was informed that the remainder of the evaluation will be completed by another provider, this initial triage assessment does not replace that evaluation, and the importance of remaining in the ED until their evaluation is complete.     Osvaldo Shipper, [place] 12/26/21 2135

## 2022-01-03 DIAGNOSIS — N939 Abnormal uterine and vaginal bleeding, unspecified: Secondary | ICD-10-CM | POA: Diagnosis not present

## 2022-01-03 DIAGNOSIS — D250 Submucous leiomyoma of uterus: Secondary | ICD-10-CM | POA: Diagnosis not present

## 2022-01-06 ENCOUNTER — Other Ambulatory Visit: Payer: Self-pay | Admitting: Physician Assistant

## 2022-01-06 ENCOUNTER — Ambulatory Visit
Admission: RE | Admit: 2022-01-06 | Discharge: 2022-01-06 | Disposition: A | Discharge status: Home / Self Care | Payer: BC Managed Care – PPO | Source: Ambulatory Visit | Attending: Physician Assistant | Admitting: Physician Assistant

## 2022-01-06 DIAGNOSIS — R079 Chest pain, unspecified: Secondary | ICD-10-CM

## 2022-01-06 DIAGNOSIS — Z8249 Family history of ischemic heart disease and other diseases of the circulatory system: Secondary | ICD-10-CM | POA: Diagnosis not present

## 2022-01-06 DIAGNOSIS — E7800 Pure hypercholesterolemia, unspecified: Secondary | ICD-10-CM | POA: Diagnosis not present

## 2022-01-18 ENCOUNTER — Encounter: Payer: Self-pay | Admitting: Internal Medicine

## 2022-01-18 ENCOUNTER — Ambulatory Visit: Payer: BC Managed Care – PPO | Admitting: Internal Medicine

## 2022-01-18 VITALS — BP 129/81 | HR 83 | Ht 67.0 in | Wt 152.0 lb

## 2022-01-18 DIAGNOSIS — Z8249 Family history of ischemic heart disease and other diseases of the circulatory system: Secondary | ICD-10-CM

## 2022-01-18 DIAGNOSIS — R079 Chest pain, unspecified: Secondary | ICD-10-CM | POA: Diagnosis not present

## 2022-01-18 NOTE — Progress Notes (Signed)
Primary Physician/Referring:  Pcp, No  Patient ID: Lacey Hernandez, female    DOB: December 19, 1975, 46 y.o.   MRN: [id number]  Chief Complaint  Patient presents with   Chest Pain   New Patient (Initial Visit)   HPI:    Lacey Hernandez  is a 46 y.o. female with no reported past medical history who is here to establish care with cardiology due to chest pain.  Patient states this has been going on for a few weeks now.  It can happen anytime, it for started at rest.  It feels sharp and it is located underneath her left breast.  Patient has never had anything like this before.  She has been taking aspirin for this and it has helped a little bit.  She does not smoke or drink alcohol.  She denies any cardiac history in herself.  She does have extensive family history of early heart disease in her mom, aunt, and grandfather.  She went to the ER about a week ago because the chest pain was really bothering her and the work-up there was negative for cardiac cause.  Past Medical History:  Diagnosis Date   Anxiety    Insomnia    History reviewed. No pertinent surgical history. Family History  Problem Relation Age of Onset   Stroke Mother    Hypertension Mother    Stroke Maternal Grandmother    Heart disease Maternal Grandfather     Social History   Tobacco Use   Smoking status: Never   Smokeless tobacco: Never  Substance Use Topics   Alcohol use: No   Marital Status: Single  ROS  Review of Systems  Cardiovascular:  Positive for chest pain.   Objective  Blood pressure 129/81, pulse 83, height 5\' 7"  (1.702 m), weight 152 lb (68.9 kg). Body mass index is 23.81 kg/m.     01/18/2022   11:51 AM 12/26/2021    9:26 PM 03/14/2021    9:05 AM  Vitals with BMI  Height 5\' 7"   5\' 7"   Weight 152 lbs  156 lbs 10 oz  BMI 23.8  24.52  Systolic 129 117 03/16/2021  Diastolic 81 71 76  Pulse 83 67 88     Physical Exam Vitals reviewed.  HENT:     Head: Normocephalic and atraumatic.  Cardiovascular:      Rate and Rhythm: Normal rate and regular rhythm.     Pulses: Normal pulses.     Heart sounds: Normal heart sounds. No murmur heard. Pulmonary:     Effort: Pulmonary effort is normal.  Abdominal:     General: Bowel sounds are normal.  Musculoskeletal:     Right lower leg: No edema.     Left lower leg: No edema.  Skin:    General: Skin is warm and dry.  Neurological:     Mental Status: She is alert.     Medications and allergies  No Known Allergies   Medication list after today's encounter   Current Outpatient Medications:    ALPRAZolam (XANAX) 0.5 MG tablet, Take 0.5 mg by mouth at bedtime., Disp: , Rfl: 2   mirtazapine (REMERON) 15 MG tablet, Take 15 mg by mouth at bedtime., Disp: , Rfl:    pantoprazole (PROTONIX) 40 MG tablet, TAKE 1 TABLET BY MOUTH 20-30 MINUTES BEFORE BREAKFAST DAILY, Disp: 90 tablet, Rfl: 1  Laboratory examination:   Lab Results  Component Value Date   NA 141 12/26/2021   K 3.7 12/26/2021   CO2 22 12/26/2021  GLUCOSE 100 (H) 12/26/2021   BUN 9 12/26/2021   CREATININE 0.72 12/26/2021   CALCIUM 9.9 12/26/2021   GFRNONAA >60 12/26/2021       Latest Ref Rng & Units 12/26/2021   10:05 PM 02/23/2021   12:00 PM 10/16/2017   11:24 AM  CMP  Glucose 70 - 99 mg/dL 161  096  045   BUN 6 - 20 mg/dL 9  9  14    Creatinine 0.44 - 1.00 mg/dL  4.09  8.11   Sodium 135 - 145 mmol/L 141  137  142   Potassium 3.5 - 5.1 mmol/L 3.7  3.9  3.2   Chloride 98 - 111 mmol/L 110  114  110   CO2 22 - 32 mmol/L 22  15  20    Calcium 8.9 - 10.3 mg/dL 9.9  9.1  8.8   Total Protein 6.5 - 8.1 g/dL 7.1   7.2   Total Bilirubin 0.3 - 1.2 mg/dL 0.4   0.9   Alkaline Phos 38 - 126 U/L 68   76   AST 15 - 41 U/L 14   17   ALT 0 - 44 U/L 12   14       Latest Ref Rng & Units 12/26/2021   10:05 PM 02/23/2021   12:00 PM 10/16/2017   11:24 AM  CBC  WBC 4.0 - 10.5 K/uL 8.1  7.5  10.2   Hemoglobin 12.0 - 15.0 g/dL 02/25/2021  10/18/2017  78.2   Hematocrit 36.0 - 46.0 % 38.2  40.8  37.7    Platelets 150 - 400 K/uL 280  328  320     Lipid Panel No results for input(s): "CHOL", "TRIG", "LDLCALC", "VLDL", "HDL", "CHOLHDL", "LDLDIRECT" in the last 8760 hours.  HEMOGLOBIN A1C No results found for: "HGBA1C", "MPG" TSH No results for input(s): "TSH" in the last 8760 hours.  External labs:     Radiology:    Cardiac Studies:   No results found for this or any previous visit from the past 1095 days.     No results found for this or any previous visit from the past 1095 days.    EKG:   01/18/2022: NSR with iRBBB, no evidence of ischemia  Assessment     ICD-10-CM   1. Chest pain of uncertain etiology  R07.9 EKG 12-Lead    CT CORONARY MORPH W/CTA COR W/SCORE W/CA W/CM &/OR WO/CM    2. Family history of early CAD  Z39.49        Orders Placed This Encounter  Procedures   CT CORONARY MORPH W/CTA COR W/SCORE W/CA W/CM &/OR WO/CM    Standing Status:   Future    Standing Expiration Date:   04/20/2022    Order Specific Question:   If indicated for the ordered procedure, I authorize the administration of contrast media per Radiology protocol    Answer:   Yes    Order Specific Question:   Is patient pregnant?    Answer:   No    Order Specific Question:   Preferred Imaging Location?    Answer:   [facility]   EKG 12-Lead    No orders of the defined types were placed in this encounter.   There are no discontinued medications.   Recommendations:   Lacey Hernandez is a 46 y.o.  female with chest pain  Chest pain of uncertain etiology Coronary CTA ordered given age and family history Chest pain seems musculoskeletal in nature  Family history of early CAD Grandfather had massive MI at age 37 Mom and aunt had heart disease in late 40s/early 72s Follow-up PRN     Clotilde Dieter, DO, [facility]  01/18/2022, 12:32 PM Office: [phone] Pager: [phone]

## 2022-02-06 DIAGNOSIS — F341 Dysthymic disorder: Secondary | ICD-10-CM | POA: Diagnosis not present

## 2022-02-06 DIAGNOSIS — F411 Generalized anxiety disorder: Secondary | ICD-10-CM | POA: Diagnosis not present

## 2022-02-06 DIAGNOSIS — F339 Major depressive disorder, recurrent, unspecified: Secondary | ICD-10-CM | POA: Diagnosis not present

## 2022-03-17 ENCOUNTER — Other Ambulatory Visit: Payer: Self-pay

## 2022-03-17 DIAGNOSIS — R079 Chest pain, unspecified: Secondary | ICD-10-CM

## 2022-03-17 DIAGNOSIS — Z8249 Family history of ischemic heart disease and other diseases of the circulatory system: Secondary | ICD-10-CM

## 2022-03-17 MED ORDER — METOPROLOL TARTRATE 50 MG PO TABS: 50.0000 mg | ORAL_TABLET | Freq: Two times a day (BID) | ORAL | 0 refills | Status: DC

## 2022-03-22 ENCOUNTER — Other Ambulatory Visit: Payer: Self-pay

## 2022-03-22 MED ORDER — METOPROLOL TARTRATE 50 MG PO TABS: 50.0000 mg | ORAL_TABLET | Freq: Two times a day (BID) | ORAL | 0 refills | Status: AC

## 2022-03-30 ENCOUNTER — Other Ambulatory Visit: Payer: Self-pay | Admitting: Internal Medicine

## 2022-03-30 DIAGNOSIS — R079 Chest pain, unspecified: Secondary | ICD-10-CM | POA: Diagnosis not present

## 2022-03-30 DIAGNOSIS — Z8249 Family history of ischemic heart disease and other diseases of the circulatory system: Secondary | ICD-10-CM | POA: Diagnosis not present

## 2022-03-31 LAB — BASIC METABOLIC PANEL
BUN/Creatinine Ratio: 18 (ref 9–23)
BUN: 13 mg/dL (ref 6–24)
CO2: 20 mmol/L (ref 20–29)
Calcium: 9 mg/dL (ref 8.7–10.2)
Chloride: 109 mmol/L — ABNORMAL HIGH (ref 96–106)
Creatinine, Ser: 0.73 mg/dL (ref 0.57–1.00)
Glucose: 92 mg/dL (ref 70–99)
Potassium: 4.6 mmol/L (ref 3.5–5.2)
Sodium: 146 mmol/L — ABNORMAL HIGH (ref 134–144)
eGFR: 103 mL/min/{1.73_m2} (ref >59)

## 2022-04-03 ENCOUNTER — Telehealth (HOSPITAL_COMMUNITY): Payer: Self-pay | Admitting: *Deleted

## 2022-04-03 NOTE — Telephone Encounter (Signed)
Reaching out to patient to offer assistance regarding upcoming cardiac imaging study; pt verbalizes understanding of appt date/time, parking situation and where to check in, pre-test NPO status and medications ordered, and verified current allergies; name and call back number provided for further questions should they arise  Jubal Rademaker RN Navigator Cardiac Imaging [facility] [phone] office [phone] cell  Patient to take 50mg metoprolol tartrate two hours prior to her cardiac CT scan.  She is aware to arrive at 9am. 

## 2022-04-04 ENCOUNTER — Ambulatory Visit (HOSPITAL_COMMUNITY)
Admission: RE | Admit: 2022-04-04 | Discharge: 2022-04-04 | Disposition: A | Discharge status: Home / Self Care | Payer: BC Managed Care – PPO | Source: Ambulatory Visit | Attending: Internal Medicine | Admitting: Internal Medicine

## 2022-04-04 DIAGNOSIS — R079 Chest pain, unspecified: Secondary | ICD-10-CM | POA: Diagnosis not present

## 2022-04-04 MED ORDER — NITROGLYCERIN 0.4 MG SL SUBL
SUBLINGUAL_TABLET | SUBLINGUAL | Status: AC
  Filled 2022-04-04: qty 2

## 2022-04-04 MED ORDER — IOHEXOL 350 MG/ML SOLN
100.0000 mL | Freq: Once | INTRAVENOUS | Status: AC | PRN
  Administered 2022-04-04: 100 mL via INTRAVENOUS

## 2022-04-04 MED ORDER — NITROGLYCERIN 0.4 MG SL SUBL
0.8000 mg | SUBLINGUAL_TABLET | Freq: Once | SUBLINGUAL | Status: AC
  Administered 2022-04-04: 0.8 mg via SUBLINGUAL

## 2022-04-08 DIAGNOSIS — Z8249 Family history of ischemic heart disease and other diseases of the circulatory system: Secondary | ICD-10-CM | POA: Diagnosis not present

## 2022-04-08 DIAGNOSIS — R079 Chest pain, unspecified: Secondary | ICD-10-CM | POA: Diagnosis not present

## 2022-04-08 DIAGNOSIS — R072 Precordial pain: Secondary | ICD-10-CM | POA: Insufficient documentation

## 2022-04-09 NOTE — Progress Notes (Signed)
Perfect score

## 2022-04-10 NOTE — Progress Notes (Signed)
Called and spoke with patient regarding her CT coronary results.

## 2022-04-21 DIAGNOSIS — Z8249 Family history of ischemic heart disease and other diseases of the circulatory system: Secondary | ICD-10-CM | POA: Diagnosis not present

## 2022-04-21 DIAGNOSIS — D259 Leiomyoma of uterus, unspecified: Secondary | ICD-10-CM | POA: Diagnosis not present

## 2022-04-21 DIAGNOSIS — E7800 Pure hypercholesterolemia, unspecified: Secondary | ICD-10-CM | POA: Diagnosis not present

## 2022-04-21 DIAGNOSIS — F411 Generalized anxiety disorder: Secondary | ICD-10-CM | POA: Diagnosis not present

## 2022-04-21 DIAGNOSIS — Z0000 Encounter for general adult medical examination without abnormal findings: Secondary | ICD-10-CM | POA: Diagnosis not present

## 2022-04-21 DIAGNOSIS — N920 Excessive and frequent menstruation with regular cycle: Secondary | ICD-10-CM | POA: Diagnosis not present

## 2022-05-15 ENCOUNTER — Encounter (HOSPITAL_BASED_OUTPATIENT_CLINIC_OR_DEPARTMENT_OTHER): Payer: Self-pay | Admitting: Obstetrics and Gynecology

## 2022-05-15 NOTE — Progress Notes (Addendum)
Spoke w/ via phone for pre-op interview--- Lacey Hernandez Lab needs dos----  UPT, T&S and CBC per surgeon             Lab results------ Current EKG dated 01/18/22 in Epic. COVID test -----patient states asymptomatic no test needed Arrive at -------1000 NPO after MN NO Solid Food.  Clear liquids from MN until---0900 Med rec completed Medications to take morning of surgery -----NONE Diabetic medication ----- Patient instructed no nail polish to be worn day of surgery Patient instructed to bring photo id and insurance card day of surgery Patient aware to have Driver (ride ) / caregiver [name]   for 24 hours after surgery  Patient Special Instructions ----- Pre-Op special Istructions ----- Patient verbalized understanding of instructions that were given at this phone interview. Patient denies shortness of breath, chest pain, fever, cough at this phone interview.  Patient has tragus new piercing's,she states they cannot be removed. informed patient that she needed to remove piercing's prior to surgery.

## 2022-05-19 ENCOUNTER — Other Ambulatory Visit: Payer: Self-pay

## 2022-05-19 ENCOUNTER — Ambulatory Visit (HOSPITAL_BASED_OUTPATIENT_CLINIC_OR_DEPARTMENT_OTHER): Payer: BC Managed Care – PPO | Admitting: Anesthesiology

## 2022-05-19 ENCOUNTER — Encounter (HOSPITAL_BASED_OUTPATIENT_CLINIC_OR_DEPARTMENT_OTHER)
Admission: RE | Disposition: A | Discharge status: Home / Self Care | Payer: Self-pay | Source: Home / Self Care | Attending: Obstetrics and Gynecology

## 2022-05-19 ENCOUNTER — Encounter (HOSPITAL_BASED_OUTPATIENT_CLINIC_OR_DEPARTMENT_OTHER): Payer: Self-pay | Admitting: Obstetrics and Gynecology

## 2022-05-19 ENCOUNTER — Ambulatory Visit (HOSPITAL_BASED_OUTPATIENT_CLINIC_OR_DEPARTMENT_OTHER)
Admission: RE | Admit: 2022-05-19 | Discharge: 2022-05-19 | Disposition: A | Discharge status: Home / Self Care | Payer: BC Managed Care – PPO | Attending: Obstetrics and Gynecology | Admitting: Obstetrics and Gynecology

## 2022-05-19 DIAGNOSIS — Z01818 Encounter for other preprocedural examination: Secondary | ICD-10-CM

## 2022-05-19 DIAGNOSIS — N939 Abnormal uterine and vaginal bleeding, unspecified: Secondary | ICD-10-CM | POA: Insufficient documentation

## 2022-05-19 DIAGNOSIS — N841 Polyp of cervix uteri: Secondary | ICD-10-CM | POA: Diagnosis not present

## 2022-05-19 DIAGNOSIS — D259 Leiomyoma of uterus, unspecified: Secondary | ICD-10-CM | POA: Diagnosis not present

## 2022-05-19 DIAGNOSIS — F419 Anxiety disorder, unspecified: Secondary | ICD-10-CM | POA: Insufficient documentation

## 2022-05-19 DIAGNOSIS — D250 Submucous leiomyoma of uterus: Secondary | ICD-10-CM | POA: Insufficient documentation

## 2022-05-19 HISTORY — PX: DILATATION & CURETTAGE/HYSTEROSCOPY WITH MYOSURE: SHX6511

## 2022-05-19 HISTORY — DX: Pure hypercholesterolemia, unspecified: E78.00

## 2022-05-19 LAB — POCT PREGNANCY, URINE: Preg Test, Ur: NEGATIVE

## 2022-05-19 LAB — CBC
HCT: 40.7 % (ref 36.0–46.0)
Hemoglobin: 12.6 g/dL (ref 12.0–15.0)
MCH: 28.4 pg (ref 26.0–34.0)
MCHC: 31 g/dL (ref 30.0–36.0)
MCV: 91.9 fL (ref 80.0–100.0)
Platelets: 275 10*3/uL (ref 150–400)
RBC: 4.43 MIL/uL (ref 3.87–5.11)
RDW: 14.6 % (ref 11.5–15.5)
WBC: 6 10*3/uL (ref 4.0–10.5)
nRBC: 0 % (ref 0.0–0.2)

## 2022-05-19 LAB — TYPE AND SCREEN
ABO/RH(D): O POS
Antibody Screen: NEGATIVE

## 2022-05-19 LAB — ABO/RH: ABO/RH(D): O POS

## 2022-05-19 SURGERY — DILATATION & CURETTAGE/HYSTEROSCOPY WITH MYOSURE
Anesthesia: General | Site: Vagina

## 2022-05-19 MED ORDER — LACTATED RINGERS IV SOLN: INTRAVENOUS | Status: DC

## 2022-05-19 MED ORDER — IBUPROFEN 200 MG PO TABS: 600.0000 mg | ORAL_TABLET | Freq: Four times a day (QID) | ORAL | Status: AC | PRN

## 2022-05-19 MED ORDER — FENTANYL CITRATE (PF) 100 MCG/2ML IJ SOLN: 25.0000 ug | INTRAMUSCULAR | Status: DC | PRN

## 2022-05-19 MED ORDER — ACETAMINOPHEN 325 MG PO TABS: 650.0000 mg | ORAL_TABLET | Freq: Four times a day (QID) | ORAL | Status: AC | PRN

## 2022-05-19 MED ORDER — DEXAMETHASONE SODIUM PHOSPHATE 10 MG/ML IJ SOLN
INTRAMUSCULAR | Status: AC
  Filled 2022-05-19: qty 1

## 2022-05-19 MED ORDER — LIDOCAINE HCL (PF) 2 % IJ SOLN
INTRAMUSCULAR | Status: AC
  Filled 2022-05-19: qty 5

## 2022-05-19 MED ORDER — KETOROLAC TROMETHAMINE 30 MG/ML IJ SOLN
INTRAMUSCULAR | Status: DC | PRN
  Administered 2022-05-19: 30 mg via INTRAVENOUS

## 2022-05-19 MED ORDER — FENTANYL CITRATE (PF) 100 MCG/2ML IJ SOLN
INTRAMUSCULAR | Status: AC
  Filled 2022-05-19: qty 2

## 2022-05-19 MED ORDER — ONDANSETRON HCL 4 MG/2ML IJ SOLN
INTRAMUSCULAR | Status: AC
  Filled 2022-05-19: qty 2

## 2022-05-19 MED ORDER — MIDAZOLAM HCL 2 MG/2ML IJ SOLN
INTRAMUSCULAR | Status: AC
  Filled 2022-05-19: qty 2

## 2022-05-19 MED ORDER — SCOPOLAMINE 1 MG/3DAYS TD PT72
MEDICATED_PATCH | TRANSDERMAL | Status: AC
  Filled 2022-05-19: qty 1

## 2022-05-19 MED ORDER — ACETAMINOPHEN 500 MG PO TABS
1000.0000 mg | ORAL_TABLET | ORAL | Status: AC
  Administered 2022-05-19: 1000 mg via ORAL

## 2022-05-19 MED ORDER — PROPOFOL 10 MG/ML IV BOLUS
INTRAVENOUS | Status: AC
  Filled 2022-05-19: qty 20

## 2022-05-19 MED ORDER — PROPOFOL 10 MG/ML IV BOLUS
INTRAVENOUS | Status: DC | PRN
  Administered 2022-05-19: 200 mg via INTRAVENOUS

## 2022-05-19 MED ORDER — MIDAZOLAM HCL 5 MG/5ML IJ SOLN
INTRAMUSCULAR | Status: DC | PRN
  Administered 2022-05-19: 2 mg via INTRAVENOUS

## 2022-05-19 MED ORDER — SODIUM CHLORIDE 0.9 % IR SOLN
Status: DC | PRN
  Administered 2022-05-19: 6000 mL

## 2022-05-19 MED ORDER — SCOPOLAMINE 1 MG/3DAYS TD PT72
1.0000 | MEDICATED_PATCH | TRANSDERMAL | Status: DC
  Administered 2022-05-19: 1.5 mg via TRANSDERMAL

## 2022-05-19 MED ORDER — DEXAMETHASONE SODIUM PHOSPHATE 10 MG/ML IJ SOLN
INTRAMUSCULAR | Status: DC | PRN
  Administered 2022-05-19: 10 mg via INTRAVENOUS

## 2022-05-19 MED ORDER — ACETAMINOPHEN 500 MG PO TABS
ORAL_TABLET | ORAL | Status: AC
  Filled 2022-05-19: qty 2

## 2022-05-19 MED ORDER — FENTANYL CITRATE (PF) 100 MCG/2ML IJ SOLN
INTRAMUSCULAR | Status: DC | PRN
  Administered 2022-05-19: 50 ug via INTRAVENOUS

## 2022-05-19 MED ORDER — LIDOCAINE 2% (20 MG/ML) 5 ML SYRINGE
INTRAMUSCULAR | Status: DC | PRN
  Administered 2022-05-19: 100 mg via INTRAVENOUS

## 2022-05-19 MED ORDER — LIDOCAINE HCL 1 % IJ SOLN
INTRAMUSCULAR | Status: DC | PRN
  Administered 2022-05-19: 10 mL

## 2022-05-19 SURGICAL SUPPLY — 20 items
CATH ROBINSON RED A/P 16FR (CATHETERS) ×2 IMPLANT
DEVICE MYOSURE LITE (MISCELLANEOUS) IMPLANT
DEVICE MYOSURE REACH (MISCELLANEOUS) IMPLANT
DRSG TELFA 3X8 NADH STRL (GAUZE/BANDAGES/DRESSINGS) ×2 IMPLANT
GAUZE 4X4 16PLY ~~LOC~~+RFID DBL (SPONGE) ×2 IMPLANT
GLOVE BIO SURGEON STRL SZ 6 (GLOVE) ×2 IMPLANT
GLOVE BIOGEL PI IND STRL 6.5 (GLOVE) ×2 IMPLANT
GOWN STRL REUS W/ TWL LRG LVL3 (GOWN DISPOSABLE) ×2 IMPLANT
GOWN STRL REUS W/TWL LRG LVL3 (GOWN DISPOSABLE) ×1
IV NS IRRIG 3000ML ARTHROMATIC (IV SOLUTION) IMPLANT
KIT PROCEDURE FLUENT (KITS) ×2 IMPLANT
KIT TURNOVER CYSTO (KITS) ×2 IMPLANT
MYOSURE XL FIBROID (MISCELLANEOUS) ×1
PACK VAGINAL MINOR WOMEN LF (CUSTOM PROCEDURE TRAY) ×2 IMPLANT
PAD OB MATERNITY 4.3X12.25 (PERSONAL CARE ITEMS) ×2 IMPLANT
SEAL ROD LENS SCOPE MYOSURE (ABLATOR) ×2 IMPLANT
SLEEVE SCD COMPRESS KNEE MED (STOCKING) ×2 IMPLANT
SYSTEM TISS REMOVAL MYOSURE XL (MISCELLANEOUS) IMPLANT
TOWEL OR 17X24 6PK STRL BLUE (TOWEL DISPOSABLE) ×2 IMPLANT
UNDERPAD 30X36 HEAVY ABSORB (UNDERPADS AND DIAPERS) ×2 IMPLANT

## 2022-05-19 NOTE — Anesthesia Preprocedure Evaluation (Addendum)
Anesthesia Evaluation  Patient identified by MRN, date of birth, ID band Patient awake    Reviewed: Allergy & Precautions, NPO status , Patient's Chart, lab work & pertinent test results  Airway Mallampati: I  TM Distance: >3 FB Neck ROM: Full    Dental no notable dental hx. (+) Teeth Intact, Dental Advisory Given   Pulmonary neg pulmonary ROS   Pulmonary exam normal breath sounds clear to auscultation       Cardiovascular negative cardio ROS Normal cardiovascular exam Rhythm:Regular Rate:Normal     Neuro/Psych   Anxiety     negative neurological ROS     GI/Hepatic negative GI ROS, Neg liver ROS,,,  Endo/Other  negative endocrine ROS    Renal/GU negative Renal ROS  negative genitourinary   Musculoskeletal negative musculoskeletal ROS (+)    Abdominal   Peds  Hematology negative hematology ROS (+)   Anesthesia Other Findings   Reproductive/Obstetrics                             Anesthesia Physical Anesthesia Plan  ASA: 2  Anesthesia Plan: General   Post-op Pain Management: Tylenol PO (pre-op)*   Induction: Intravenous  PONV Risk Score and Plan: 3 and Ondansetron, Dexamethasone and Midazolam  Airway Management Planned: LMA  Additional Equipment:   Intra-op Plan:   Post-operative Plan: Extubation in OR  Informed Consent: I have reviewed the patients History and Physical, chart, labs and discussed the procedure including the risks, benefits and alternatives for the proposed anesthesia with the patient or authorized representative who has indicated his/her understanding and acceptance.     Dental advisory given  Plan Discussed with: CRNA  Anesthesia Plan Comments:        Anesthesia Quick Evaluation

## 2022-05-19 NOTE — Interval H&P Note (Signed)
History and Physical Interval Note:  05/19/2022 11:32 AM  Lacey Hernandez  has presented today for surgery, with the diagnosis of submucous leiomyoma of uterus.  The various methods of treatment have been discussed with the patient and family. After consideration of risks, benefits and other options for treatment, the patient has consented to  Procedure(s) with comments: HYSTEROSCOPIC MYOMECTOMY, [place] (N/A) - fibroid removal as a surgical intervention.  The patient's history has been reviewed, patient examined, no change in status, stable for surgery.  I have reviewed the patient's chart and labs.  Questions were answered to the patient's satisfaction.     Rowland Lathe

## 2022-05-19 NOTE — Discharge Instructions (Addendum)
No acetaminophen/Tylenol until after 4:18 pm today if needed.  No ibuprofen, Advil, Aleve, Motrin, ketorolac, meloxicam, naproxen, or other NSAIDS until after 6:05 pm today if needed.     Post Anesthesia Home Care Instructions  Activity: Get plenty of rest for the remainder of the day. A responsible individual must stay with you for 24 hours following the procedure.  For the next 24 hours, DO NOT: -Drive a car -Paediatric nurse -Drink alcoholic beverages -Take any medication unless instructed by your physician -Make any legal decisions or sign important papers.  Meals: Start with liquid foods such as gelatin or soup. Progress to regular foods as tolerated. Avoid greasy, spicy, heavy foods. If nausea and/or vomiting occur, drink only clear liquids until the nausea and/or vomiting subsides. Call your physician if vomiting continues.  Special Instructions/Symptoms: Your throat may feel dry or sore from the anesthesia or the breathing tube placed in your throat during surgery. If this causes discomfort, gargle with warm salt water. The discomfort should disappear within 24 hours.  If you had a scopolamine patch placed behind your ear for the management of post- operative nausea and/or vomiting:  1. The medication in the patch is effective for 72 hours, after which it should be removed.  Wrap patch in a tissue and discard in the trash. Wash hands thoroughly with soap and water. 2. You may remove the patch earlier than 72 hours if you experience unpleasant side effects which may include dry mouth, dizziness or visual disturbances. 3. Avoid touching the patch. Wash your hands with soap and water after contact with the patch.

## 2022-05-19 NOTE — Anesthesia Procedure Notes (Signed)
Procedure Name: LMA Insertion Date/Time: 05/19/2022 11:54 AM  Performed by: Bonney Aid, CRNAPre-anesthesia Checklist: Patient identified, Emergency Drugs available, Suction available and Patient being monitored Patient Re-evaluated:Patient Re-evaluated prior to induction Oxygen Delivery Method: Circle system utilized Preoxygenation: Pre-oxygenation with 100% oxygen Induction Type: IV induction Ventilation: Mask ventilation without difficulty LMA: LMA inserted LMA Size: 4.0 Number of attempts: 1 Airway Equipment and Method: Bite block Placement Confirmation: positive ETCO2 Dental Injury: Teeth and Oropharynx as per pre-operative assessment

## 2022-05-19 NOTE — H&P (Signed)
Lacey Hernandez is an 47 y.o. female P1 with abnormal uterine bleeding secondary to submucosal leiomyoma.  11/24/21 '10 year history of painful periods, heavy - changes pad/tampon every 2 hours, can't wear tampon without pad. takes 4 ibuprofen 3 times/days throughout the whole period. States Dr. Ouida Sills offered her treatment and she had declined'  01/03/22 ' GYN Scan: 8.4cm anteverted uterus, EMS 4.31mm, 2.1cm submucosal fibroid. Normal ovaries.   Pertinent Gynecological History:  OB History: G1, P1   Menstrual History:  Patient's last menstrual period was 05/08/2022.    Past Medical History:  Diagnosis Date   Anxiety    Insomnia     History reviewed. No pertinent surgical history.  Family History  Problem Relation Age of Onset   Stroke Mother    Hypertension Mother    Stroke Maternal Grandmother    Heart disease Maternal Grandfather     Social History:  reports that she has never smoked. She has never used smokeless tobacco. She reports that she does not drink alcohol and does not use drugs.  Allergies: No Known Allergies  No medications prior to admission.    Review of Systems  Constitutional:  Negative for fever.  HENT:  Negative for sore throat.   Eyes:  Negative for visual disturbance.  Respiratory:  Negative for shortness of breath.   Cardiovascular:  Negative for chest pain.  Gastrointestinal:  Negative for abdominal pain.  Genitourinary:  Positive for menstrual problem. Negative for pelvic pain.  Musculoskeletal:  Negative for neck stiffness.  Skin:  Negative for rash.  Neurological:  Negative for headaches.  Psychiatric/Behavioral:  Negative for suicidal ideas.     Height 5\' 7"  (1.702 m), weight 67.1 kg, last menstrual period 05/08/2022. Physical Exam  Chaperone Chaperone: present  Constitutional *General Appearance: healthy-appearing, well-nourished, well-developed  Head Head: normocephalic  Neck *Thyroid: no enlargement, no nodules,  non-tender  Lymph Nodes *Palpation: non-tender supraclavicular nodes, non-tender axillary nodes, non-tender inguinal nodes  Cardiovascular *Auscultation: RRR, no murmur  Lungs *Respiratory Effort: no accessory muscle usage, no intercostal retractions *Auscultation: clear to auscultation, no wheezing, no rales/crackles, no rhonchi  *Breast Bilateral: no skin changes, nipple appearance: normal, no abnormal nipple secretions, no tenderness, no masses palpable  Abdomen *Inspection/Palpation/Auscultation: non-distended, no tenderness, no rebound, no guarding, soft  Female Genitalia Vulva: no masses, no atrophy, no lesions Mons: normal Labia Majora: normal, no erythema, no excoriation, no atrophy, no discoloration, no lesions Labia Minora: normal, no erythema, no excoriation, no atrophy, no discoloration, no lesions Introitus: normal *Vagina: normal, no discharge, no blood present, no erythema, no atrophy, no lesions, no ulcers, no masses, no tenderness *Cervix: grossly normal, no lesions, no discharge, no bleeding, no cervical motion tenderness *Uterus: normal size, normal contour, midline, mobile, non-tender, anteverted *Urethral Meatus/ Urethra: normal meatus *Adnexa/Parametria: no mass palpable, no tenderness  Extremities Legs: no calf tenderness  Neurological System Impressions: motor: no deficits, sensory: no deficits  Psychiatric *Orientation: to person, to place, to time *Mood and Affect: active and alert, normal mood, normal affect No results found for this or any previous visit (from the past 24 hour(s)).  No results found. CBC    Component Value Date/Time   WBC 8.1 12/26/2021 2205   RBC 4.30 12/26/2021 2205   HGB 12.5 12/26/2021 2205   HCT 38.2 12/26/2021 2205   PLT 280 12/26/2021 2205   MCV 88.8 12/26/2021 2205   MCH 29.1 12/26/2021 2205   MCHC 32.7 12/26/2021 2205   RDW 14.6 12/26/2021 2205    Assessment/Plan: 46Y  P1 with abnormal uterine bleeding -  submucosal leiomyoma - Plan: hysteroscopic myomectomy, dilation and curettage - Reviewed risks of procedure including infection, bleeding, uterine perforation, blood transfusion, failure to achieve desired result. All questions answered.   Rowland Lathe 05/19/2022, 7:15 AM

## 2022-05-19 NOTE — Anesthesia Postprocedure Evaluation (Signed)
Anesthesia Post Note  Patient: Haleah Marotti  Procedure(s) Performed: HYSTEROSCOPIC MYOMECTOMY, DILATATION & CURETTAGE/HYSTEROSCOPY WITH MYOSURE (Vagina )     Patient location during evaluation: PACU Anesthesia Type: General Level of consciousness: awake and alert Pain management: pain level controlled Vital Signs Assessment: post-procedure vital signs reviewed and stable Respiratory status: spontaneous breathing, nonlabored ventilation, respiratory function stable and patient connected to nasal cannula oxygen Cardiovascular status: blood pressure returned to baseline and stable Postop Assessment: no apparent nausea or vomiting Anesthetic complications: no  No notable events documented.  Last Vitals:  Vitals:   05/19/22 1300 05/19/22 1314  BP: 116/66 (!) 112/56  Pulse: 77 66  Resp: 16 16  Temp:  36.6 C  SpO2: 99% 98%    Last Pain:  Vitals:   05/19/22 1314  TempSrc:   PainSc: 0-No pain                 Aidynn Polendo L Alexxis Mackert

## 2022-05-19 NOTE — Op Note (Addendum)
05/19/2022   12:27 PM   PATIENT:  Lacey Hernandez  47 y.o. female   PRE-OPERATIVE DIAGNOSIS: abnormal uterine bleeding, submucosal leiomyoma   POST-OPERATIVE DIAGNOSIS:  abnormal uterine bleeding, submucosal leiomyoma   PROCEDURE:  Hysteroscopic myomectomy, dilation and curettage   SURGEON:  Surgeon(s) and Role:    * Rowland Lathe, MD - Primary     ANESTHESIA:   local and general   EBL:  10 mL    BLOOD ADMINISTERED:none   DRAINS: none    LOCAL MEDICATIONS USED:  LIDOCAINE  and Amount: 10 ml   SPECIMEN:  Source of Specimen:  submucosal leiomyoma and endometrial curettings   DISPOSITION OF SPECIMEN:  PATHOLOGY   COUNTS:  YES   PLAN OF CARE: Discharge to home after PACU   PATIENT DISPOSITION:  PACU - hemodynamically stable.   Delay start of Pharmacological VTE agent (>24hrs) due to surgical blood loss or risk of bleeding: not applicable  FINDINGS: anteverted uterus sounded to 8cm. On hysterscopic view, bilateral ostia visualized. 2cm type 0 leiomyoma with stalk from posterior uterine wall. Otherwise thin appearing endometrium. Fluid deficity 248mL.   PROCEDURE IN DETAIL:  The patient was appropriately consented and taken to the operating room where anesthesia was administered without difficulty. Thromboguards were placed and connected. She was placed in the dorsal lithotomy position in stirrups. She was examined under anesthesia and found to have an anteverted uterus. The patient was then prepped and draped in normal sterile fashion. A long speculum was inserted into the vagina. A single-tooth tenaculum was used to grasp the anterior lip of the cervix. A paracervical block was obtained by injecting a total of 44mL of 1% lidocaine at the 4 and 8 o'clock positions at the cervicovaginal junction. The uterus was sounded to 8cm. The cervical os was sequentially dilated using Hank dilators to accommodate the hysteroscope. The hysteroscope was introduced under direct visualization  and the uterus was distended with normal saline. Bilateral ostia were visualized. Findings as noted above. The Myosure XL  device was used to resect the pedunculated leiomyoma. Specimen was collected for pathology. Hemostasis was noted in the uterine cavity. The hysteroscope was removed. A sharp curettage was performed to obtain global endometrial curettings, which were collected on Telfa and sent for pathology.  The tenaculum was removed from the cervix and good hemostasis was noted at the puncture sites. The patient tolerated the procedure well. The instrument and sponge counts were correct times two. The patient was awakened from anesthesia and taken to the recovery room in stable condition.  Irene Pap, MD 05/19/22 12:31 PM

## 2022-05-19 NOTE — Brief Op Note (Signed)
05/19/2022  12:27 PM  PATIENT:  Lacey Hernandez  47 y.o. female  PRE-OPERATIVE DIAGNOSIS: abnormal uterine bleeding, submucosal leiomyoma  POST-OPERATIVE DIAGNOSIS:  abnormal uterine bleeding, submucosal leiomyoma  PROCEDURE:  Hysteroscopic myomectomy, dilation and curettage  SURGEON:  Surgeon(s) and Role:    * Rowland Lathe, MD - Primary   ANESTHESIA:   local and general  EBL:  10 mL   BLOOD ADMINISTERED:none  DRAINS: none   LOCAL MEDICATIONS USED:  LIDOCAINE  and Amount: 10 ml  SPECIMEN:  Source of Specimen:  submucosal leiomyoma and endometrial curettings  DISPOSITION OF SPECIMEN:  PATHOLOGY  COUNTS:  YES  TOURNIQUET:  * No tourniquets in log *  DICTATION: .Note written in EPIC  PLAN OF CARE: Discharge to home after PACU  PATIENT DISPOSITION:  PACU - hemodynamically stable.   Delay start of Pharmacological VTE agent (>24hrs) due to surgical blood loss or risk of bleeding: not applicable

## 2022-05-19 NOTE — Transfer of Care (Signed)
Immediate Anesthesia Transfer of Care Note  Patient: Lacey Hernandez  Procedure(s) Performed: HYSTEROSCOPIC MYOMECTOMY, DILATATION & CURETTAGE/HYSTEROSCOPY WITH MYOSURE (Vagina )  Patient Location: PACU  Anesthesia Type:General  Level of Consciousness: drowsy  Airway & Oxygen Therapy: Patient Spontanous Breathing and Patient connected to nasal cannula oxygen  Post-op Assessment: Report given to RN  Post vital signs: Reviewed and stable  Last Vitals:  Vitals Value Taken Time  BP 113/61   Temp    Pulse 87   Resp 18   SpO2 100     Last Pain:  Vitals:   05/19/22 1014  TempSrc: Oral  PainSc: 0-No pain      Patients Stated Pain Goal: 6 ([id number])  Complications: No notable events documented.

## 2022-05-22 ENCOUNTER — Encounter (HOSPITAL_BASED_OUTPATIENT_CLINIC_OR_DEPARTMENT_OTHER): Payer: Self-pay | Admitting: Obstetrics and Gynecology

## 2022-05-22 DIAGNOSIS — F339 Major depressive disorder, recurrent, unspecified: Secondary | ICD-10-CM | POA: Diagnosis not present

## 2022-05-22 DIAGNOSIS — F411 Generalized anxiety disorder: Secondary | ICD-10-CM | POA: Diagnosis not present

## 2022-05-22 LAB — SURGICAL PATHOLOGY

## 2022-08-14 DIAGNOSIS — F411 Generalized anxiety disorder: Secondary | ICD-10-CM | POA: Diagnosis not present

## 2022-08-14 DIAGNOSIS — F339 Major depressive disorder, recurrent, unspecified: Secondary | ICD-10-CM | POA: Diagnosis not present

## 2022-08-14 DIAGNOSIS — F341 Dysthymic disorder: Secondary | ICD-10-CM | POA: Diagnosis not present

## 2022-09-12 DIAGNOSIS — L570 Actinic keratosis: Secondary | ICD-10-CM | POA: Diagnosis not present

## 2022-09-12 DIAGNOSIS — L821 Other seborrheic keratosis: Secondary | ICD-10-CM | POA: Diagnosis not present

## 2022-09-12 DIAGNOSIS — X32XXXD Exposure to sunlight, subsequent encounter: Secondary | ICD-10-CM | POA: Diagnosis not present

## 2022-09-12 DIAGNOSIS — L650 Telogen effluvium: Secondary | ICD-10-CM | POA: Diagnosis not present

## 2022-11-14 DIAGNOSIS — F339 Major depressive disorder, recurrent, unspecified: Secondary | ICD-10-CM | POA: Diagnosis not present

## 2022-11-14 DIAGNOSIS — F411 Generalized anxiety disorder: Secondary | ICD-10-CM | POA: Diagnosis not present

## 2022-11-14 DIAGNOSIS — F341 Dysthymic disorder: Secondary | ICD-10-CM | POA: Diagnosis not present

## 2023-01-02 DIAGNOSIS — Z1231 Encounter for screening mammogram for malignant neoplasm of breast: Secondary | ICD-10-CM | POA: Diagnosis not present

## 2023-01-02 DIAGNOSIS — Z124 Encounter for screening for malignant neoplasm of cervix: Secondary | ICD-10-CM | POA: Diagnosis not present

## 2023-01-02 DIAGNOSIS — Z01419 Encounter for gynecological examination (general) (routine) without abnormal findings: Secondary | ICD-10-CM | POA: Diagnosis not present

## 2023-01-05 ENCOUNTER — Other Ambulatory Visit: Payer: Self-pay | Admitting: Obstetrics and Gynecology

## 2023-01-05 DIAGNOSIS — R928 Other abnormal and inconclusive findings on diagnostic imaging of breast: Secondary | ICD-10-CM

## 2023-01-19 ENCOUNTER — Ambulatory Visit
Admission: RE | Admit: 2023-01-19 | Discharge: 2023-01-19 | Disposition: A | Discharge status: Home / Self Care | Payer: BC Managed Care – PPO | Source: Ambulatory Visit | Attending: Obstetrics and Gynecology | Admitting: Obstetrics and Gynecology

## 2023-01-19 ENCOUNTER — Ambulatory Visit
Admission: RE | Admit: 2023-01-19 | Discharge: 2023-01-19 | Disposition: A | Discharge status: Home / Self Care | Payer: BC Managed Care – PPO | Source: Ambulatory Visit | Attending: Obstetrics and Gynecology

## 2023-01-19 ENCOUNTER — Other Ambulatory Visit: Payer: Self-pay | Admitting: Obstetrics and Gynecology

## 2023-01-19 DIAGNOSIS — R928 Other abnormal and inconclusive findings on diagnostic imaging of breast: Secondary | ICD-10-CM

## 2023-01-19 DIAGNOSIS — N6321 Unspecified lump in the left breast, upper outer quadrant: Secondary | ICD-10-CM | POA: Diagnosis not present

## 2023-01-19 DIAGNOSIS — N6320 Unspecified lump in the left breast, unspecified quadrant: Secondary | ICD-10-CM

## 2023-01-19 DIAGNOSIS — N6325 Unspecified lump in the left breast, overlapping quadrants: Secondary | ICD-10-CM | POA: Diagnosis not present

## 2023-01-19 DIAGNOSIS — N6012 Diffuse cystic mastopathy of left breast: Secondary | ICD-10-CM | POA: Diagnosis not present

## 2023-02-13 DIAGNOSIS — F339 Major depressive disorder, recurrent, unspecified: Secondary | ICD-10-CM | POA: Diagnosis not present

## 2023-02-13 DIAGNOSIS — F341 Dysthymic disorder: Secondary | ICD-10-CM | POA: Diagnosis not present

## 2023-02-13 DIAGNOSIS — F411 Generalized anxiety disorder: Secondary | ICD-10-CM | POA: Diagnosis not present

## 2023-03-05 ENCOUNTER — Ambulatory Visit: Payer: BC Managed Care – PPO | Admitting: Dermatology

## 2023-03-05 ENCOUNTER — Encounter: Payer: Self-pay | Admitting: Dermatology

## 2023-03-05 VITALS — BP 100/68 | HR 92

## 2023-03-05 DIAGNOSIS — L639 Alopecia areata, unspecified: Secondary | ICD-10-CM

## 2023-03-05 DIAGNOSIS — L649 Androgenic alopecia, unspecified: Secondary | ICD-10-CM | POA: Diagnosis not present

## 2023-03-05 MED ORDER — CLOBETASOL PROPIONATE 0.05 % EX SOLN: 1.0000 | Freq: Two times a day (BID) | CUTANEOUS | 3 refills | Status: AC

## 2023-03-05 MED ORDER — SAFETY SEAL MISCELLANEOUS MISC: 1.0000 [IU] | Freq: Every morning | 4 refills | Status: DC

## 2023-03-05 NOTE — Progress Notes (Signed)
   New Patient Visit   Subjective  Lacey Hernandez is a 48 y.o. female who presents for the following: hair loss Pt was diagnosed with alopecia last year by Dr. Shona who recommended pt start otc Minoxidil 2.5% which pt has been using daily but has not seen any improvement. She isn't doing anything else for it.  The following portions of the chart were reviewed this encounter and updated as appropriate: medications, allergies, medical history  Review of Systems:  No other skin or systemic complaints except as noted in HPI or Assessment and Plan.  Objective  Well appearing patient in no apparent distress; mood and affect are within normal limits.  A focused examination was performed of the following areas: scalp  Relevant exam findings are noted in the Assessment and Plan.  Exam: Diffuse thinning of hair 5mm patch of hair loss frontal scalp            Assessment & Plan   1. Alopecia Areata - Assessment: Patient with a history of alopecia areata, previously treated with minoxidil, now presents with a 5-millimeter patch of hair loss on the frontal scalp. The condition is stress-related, leading to immune-mediated attack on hair follicles. Typically resolves with topical steroids. - Plan: Prescribe clobetasol  for application to the frontal scalp and any new spots of hair loss. Anticipate a 3-4 month period for reduction in inflammation and hair regrowth. Emphasize the importance of stress management.  2. Androgenetic Alopecia - Assessment: Patient in the early stages of androgenetic alopecia, showing general thinning on the sides of the scalp. This condition is linked to perimenopause and menopause, leading to hair follicle miniaturization due to hormonal changes. Patient reports being in perimenopause with regular menstrual cycles.  - Plan: Prescribe compounded 8% minoxidil with finasteride from [facility] pharmacy for daily application to the temples and affected areas in the  morning. Discontinue previous minoxidil 2.5% solution treatment. Recommend Viviscal (vitamin supplement) and Collagen as adjunct therapies. Schedule a follow-up appointment in 4 months    Return in about 4 months (around 07/03/2023) for hair loss.  LILLETTE Lacey Hernandez, CMA, am acting as scribe for [employer], DO.   Documentation: I have reviewed the above documentation for accuracy and completeness, and I agree with the above.  Delon Lenis, DO

## 2023-03-05 NOTE — Patient Instructions (Addendum)
 Hello Lacey Hernandez,  Thank you for visiting my office today. Your dedication to addressing your hair loss concerns is commendable, and I'm pleased we could outline a comprehensive treatment plan for you. Here is a summary of the key instructions from our consultation:  - **Clobetasol **: Apply this to new spots of hair loss on the frontal scalp.    - **Duration**: Use for 3-4 months to allow inflammation to subside and hair to regrow.  - **Minoxidil 8% and Finasteride Compound**: Use this compound daily in the morning.   - **Application**: Apply a small amount to the temples and affected areas on sides.   - **Note**: This treatment is ongoing, akin to daily hygiene practices.  - **Medication Transition**: You may discontinue your current minoxidil regimen without side effects.   - **Supplements**: Begin taking Viviscal and Collagen to support hair health.  - **Follow-Up Appointment**: We will schedule a follow-up in four months to evaluate progress and adjust the treatment plan as necessary.  - **Pharmacy Coordination**: [facility] will reach out to you regarding the finasteride-minoxidil compound. They will provide four refills at approximately $40 each.  Remember, managing stress is a critical component in addressing alopecia areata. Your after-visit summary includes educational materials about the treatments and supplements discussed today.  I look forward to witnessing the progress at your next appointment.  Warm regards,  Dr. Delon Lenis Dermatology       Important Information   Due to recent changes in healthcare laws, you may see results of your pathology and/or laboratory studies on MyChart before the doctors have had a chance to review them. We understand that in some cases there may be results that are confusing or concerning to you. Please understand that not all results are received at the same time and often the doctors may need to interpret multiple results in order  to provide you with the best plan of care or course of treatment. Therefore, we ask that you please give us  2 business days to thoroughly review all your results before contacting the office for clarification. Should we see a critical lab result, you will be contacted sooner.     If You Need Anything After Your Visit   If you have any questions or concerns for your doctor, please call our main line at [phone]. If no one answers, please leave a voicemail as directed and we will return your call as soon as possible. Messages left after 4 pm will be answered the following business day.    You may also send us  a message via MyChart. We typically respond to MyChart messages within 1-2 business days.  For prescription refills, please ask your pharmacy to contact our office. Our fax number is [phone].  If you have an urgent issue when the clinic is closed that cannot wait until the next business day, you can page your doctor at the number below.     Please note that while we do our best to be available for urgent issues outside of office hours, we are not available 24/7.    If you have an urgent issue and are unable to reach us , you may choose to seek medical care at your doctor's office, retail clinic, urgent care center, or emergency room.   If you have a medical emergency, please immediately call 911 or go to the emergency department. In the event of inclement weather, please call our main line at [phone] for an update on the status of any delays or closures.  Dermatology Medication Tips: Please keep the boxes that topical medications come in in order to help keep track of the instructions about where and how to use these. Pharmacies typically print the medication instructions only on the boxes and not directly on the medication tubes.   If your medication is too expensive, please contact our office at [phone] or send us  a message through MyChart.    We are unable to tell  what your co-pay for medications will be in advance as this is different depending on your insurance coverage. However, we may be able to find a substitute medication at lower cost or fill out paperwork to get insurance to cover a needed medication.    If a prior authorization is required to get your medication covered by your insurance company, please allow us  1-2 business days to complete this process.   Drug prices often vary depending on where the prescription is filled and some pharmacies may offer cheaper prices.   The website www.goodrx.com contains coupons for medications through different pharmacies. The prices here do not account for what the cost may be with help from insurance (it may be cheaper with your insurance), but the website can give you the price if you did not use any insurance.  - You can print the associated coupon and take it with your prescription to the pharmacy.  - You may also stop by our office during regular business hours and pick up a GoodRx coupon card.  - If you need your prescription sent electronically to a different pharmacy, notify our office through [facility] or by phone at [phone]

## 2023-05-10 DIAGNOSIS — E7800 Pure hypercholesterolemia, unspecified: Secondary | ICD-10-CM | POA: Diagnosis not present

## 2023-05-10 DIAGNOSIS — D500 Iron deficiency anemia secondary to blood loss (chronic): Secondary | ICD-10-CM | POA: Diagnosis not present

## 2023-05-10 DIAGNOSIS — Z0000 Encounter for general adult medical examination without abnormal findings: Secondary | ICD-10-CM | POA: Diagnosis not present

## 2023-05-10 DIAGNOSIS — F3341 Major depressive disorder, recurrent, in partial remission: Secondary | ICD-10-CM | POA: Diagnosis not present

## 2023-05-10 DIAGNOSIS — Z8249 Family history of ischemic heart disease and other diseases of the circulatory system: Secondary | ICD-10-CM | POA: Diagnosis not present

## 2023-05-14 DIAGNOSIS — M5459 Other low back pain: Secondary | ICD-10-CM | POA: Diagnosis not present

## 2023-05-14 DIAGNOSIS — M545 Low back pain, unspecified: Secondary | ICD-10-CM | POA: Diagnosis not present

## 2023-05-21 DIAGNOSIS — F411 Generalized anxiety disorder: Secondary | ICD-10-CM | POA: Diagnosis not present

## 2023-05-21 DIAGNOSIS — F3341 Major depressive disorder, recurrent, in partial remission: Secondary | ICD-10-CM | POA: Diagnosis not present

## 2023-05-21 DIAGNOSIS — G479 Sleep disorder, unspecified: Secondary | ICD-10-CM | POA: Diagnosis not present

## 2023-05-21 DIAGNOSIS — G4700 Insomnia, unspecified: Secondary | ICD-10-CM | POA: Diagnosis not present

## 2023-05-22 DIAGNOSIS — F411 Generalized anxiety disorder: Secondary | ICD-10-CM | POA: Diagnosis not present

## 2023-05-22 DIAGNOSIS — F341 Dysthymic disorder: Secondary | ICD-10-CM | POA: Diagnosis not present

## 2023-05-22 DIAGNOSIS — F339 Major depressive disorder, recurrent, unspecified: Secondary | ICD-10-CM | POA: Diagnosis not present

## 2023-07-05 ENCOUNTER — Encounter: Payer: Self-pay | Admitting: Dermatology

## 2023-07-05 ENCOUNTER — Ambulatory Visit: Payer: BC Managed Care – PPO | Admitting: Dermatology

## 2023-07-05 VITALS — BP 102/69 | HR 87

## 2023-07-05 DIAGNOSIS — L649 Androgenic alopecia, unspecified: Secondary | ICD-10-CM | POA: Diagnosis not present

## 2023-07-05 DIAGNOSIS — Z79899 Other long term (current) drug therapy: Secondary | ICD-10-CM

## 2023-07-05 NOTE — Progress Notes (Signed)
   Follow-Up Visit   Subjective  Lacey Hernandez is a 48 y.o. female who presents for the following: alopecia  Lacey Hernandez, a patient with androgenetic alopecia, presents for a follow-up visit after four months of treatment. She reports improvement in her condition, noting less shedding and more hair regrowth.  The patient has been adhering to her prescribed treatment regimen, which includes daily application of finasteride and minoxidil from [facility] every morning. She has also been taking supplements including collagen, liquid collagen supplement, and Viviscal. Lacey reports that she has not been using the clobetasol  prescription that was sent to [facility] for isolated patches, as she does not use that pharmacy. However, she notes that these areas seem to have healed on their own without intervention.  Lacey expresses satisfaction with her progress, stating she is "thrilled" with the results. She has noticed significant improvement, particularly in the previously affected patch, which now shows a lot of short hairs growing through   The following portions of the chart were reviewed this encounter and updated as appropriate: medications, allergies, medical history  Review of Systems:  No other skin or systemic complaints except as noted in HPI or Assessment and Plan.  Objective  Well appearing patient in no apparent distress; mood and affect are within normal limits.  A focused examination was performed of the following areas: Scalp   Relevant exam findings are noted in the Assessment and Plan.          Assessment & Plan   1. Androgenetic alopecia - Assessment: Patient diagnosed with androgenetic alopecia, presenting with thinning and excessive shedding. After 4 months of treatment with finasteride and minoxidil from [facility], patient reports less shedding and more regrowth. Visual examination confirms baby hairs and short hairs growing through previously affected areas. Previously  noted inflammation in isolated patches has resolved without the need for clobetasol . Patient is also taking collagen and Viviscal supplements.  - Plan:    Continue topical finasteride and minoxidil from [facility], applied every morning    Continue Viviscal and collagen supplements    Discontinue clobetasol  as it is no longer needed    Patient education:     - Informed that treatment is effective while taken consistently     - Advised that after one year, may switch to every other day or a couple of times a week for maintenance     - Warned that stopping treatment for 3 months or more may result in return of genetic hair loss patterns    Follow-up in 6 months to assess treatment efficacy and consider switching to oral medication    Annual follow-up thereafter if stable    Patient instructed to request refill via MyChart if topical medication runs out before next appointment  Long term medication management.  Patient is using long term (months to years) prescription medication  to control their dermatologic condition.  These medications require periodic monitoring to evaluate for efficacy and side effects and may require periodic laboratory monitoring.        Return in about 6 months (around 01/05/2024) for Hair Loss f/u .  Exie Holler, CMA, am acting as scribe for [employer], DO.   Documentation: I have reviewed the above documentation for accuracy and completeness, and I agree with the above.  Louana Roup, DO

## 2023-07-05 NOTE — Patient Instructions (Addendum)
 Hello Lacey Hernandez,  Thank you for visiting today. Here is a summary of the key instructions:  - Medications and Supplements:   - Continue using topical finasteride and minoxidil from [facility] every morning   - Continue taking Viviscal supplement   - Continue taking collagen supplement  - Follow-up:   - Next appointment in 6 months   - Send a message on MyChart for a refill if you run out of the topical medication  - Treatment Plan:   - Continue daily treatment for the next year   - After one year, you may reduce the frequency for maintenance   - Do not stop treatment for 3 months or more, as it may reverse progress  Please reach out if you have any questions or concerns.  Warm regards,  Dr. Louana Roup, Dermatology    Important Information  Due to recent changes in healthcare laws, you may see results of your pathology and/or laboratory studies on MyChart before the doctors have had a chance to review them. We understand that in some cases there may be results that are confusing or concerning to you. Please understand that not all results are received at the same time and often the doctors may need to interpret multiple results in order to provide you with the best plan of care or course of treatment. Therefore, we ask that you please give us  2 business days to thoroughly review all your results before contacting the office for clarification. Should we see a critical lab result, you will be contacted sooner.   If You Need Anything After Your Visit  If you have any questions or concerns for your doctor, please call our main line at [phone] If no one answers, please leave a voicemail as directed and we will return your call as soon as possible. Messages left after 4 pm will be answered the following business day.   You may also send us  a message via MyChart. We typically respond to MyChart messages within 1-2 business days.  For prescription refills, please ask your pharmacy to  contact our office. Our fax number is [phone].  If you have an urgent issue when the clinic is closed that cannot wait until the next business day, you can page your doctor at the number below.    Please note that while we do our best to be available for urgent issues outside of office hours, we are not available 24/7.   If you have an urgent issue and are unable to reach us , you may choose to seek medical care at your doctor's office, retail clinic, urgent care center, or emergency room.  If you have a medical emergency, please immediately call 911 or go to the emergency department. In the event of inclement weather, please call our main line at [phone] for an update on the status of any delays or closures.  Dermatology Medication Tips: Please keep the boxes that topical medications come in in order to help keep track of the instructions about where and how to use these. Pharmacies typically print the medication instructions only on the boxes and not directly on the medication tubes.   If your medication is too expensive, please contact our office at [phone] or send us  a message through MyChart.   We are unable to tell what your co-pay for medications will be in advance as this is different depending on your insurance coverage. However, we may be able to find a substitute medication at lower cost or fill out  paperwork to get insurance to cover a needed medication.   If a prior authorization is required to get your medication covered by your insurance company, please allow us  1-2 business days to complete this process.  Drug prices often vary depending on where the prescription is filled and some pharmacies may offer cheaper prices.  The website www.goodrx.com contains coupons for medications through different pharmacies. The prices here do not account for what the cost may be with help from insurance (it may be cheaper with your insurance), but the website can give you the price  if you did not use any insurance.  - You can print the associated coupon and take it with your prescription to the pharmacy.  - You may also stop by our office during regular business hours and pick up a GoodRx coupon card.  - If you need your prescription sent electronically to a different pharmacy, notify our office through [facility] or by phone at [phone]

## 2023-07-19 ENCOUNTER — Ambulatory Visit
Admission: RE | Admit: 2023-07-19 | Discharge: 2023-07-19 | Disposition: A | Discharge status: Home / Self Care | Payer: BC Managed Care – PPO | Source: Ambulatory Visit | Attending: Obstetrics and Gynecology | Admitting: Obstetrics and Gynecology

## 2023-07-19 ENCOUNTER — Other Ambulatory Visit: Payer: Self-pay | Admitting: Obstetrics and Gynecology

## 2023-07-19 ENCOUNTER — Encounter: Payer: Self-pay | Admitting: Dermatology

## 2023-07-19 DIAGNOSIS — N6002 Solitary cyst of left breast: Secondary | ICD-10-CM | POA: Diagnosis not present

## 2023-07-19 DIAGNOSIS — R928 Other abnormal and inconclusive findings on diagnostic imaging of breast: Secondary | ICD-10-CM

## 2023-07-19 DIAGNOSIS — N6325 Unspecified lump in the left breast, overlapping quadrants: Secondary | ICD-10-CM | POA: Diagnosis not present

## 2023-07-19 DIAGNOSIS — N6320 Unspecified lump in the left breast, unspecified quadrant: Secondary | ICD-10-CM

## 2023-07-19 MED ORDER — SAFETY SEAL MISCELLANEOUS MISC: 1.0000 [IU] | Freq: Every morning | 4 refills | Status: DC

## 2023-07-20 ENCOUNTER — Other Ambulatory Visit: Payer: Self-pay | Admitting: Obstetrics and Gynecology

## 2023-07-20 ENCOUNTER — Ambulatory Visit
Admission: RE | Admit: 2023-07-20 | Discharge: 2023-07-20 | Disposition: A | Discharge status: Home / Self Care | Source: Ambulatory Visit | Attending: Obstetrics and Gynecology | Admitting: Obstetrics and Gynecology

## 2023-07-20 DIAGNOSIS — N6325 Unspecified lump in the left breast, overlapping quadrants: Secondary | ICD-10-CM | POA: Diagnosis not present

## 2023-07-20 DIAGNOSIS — N6320 Unspecified lump in the left breast, unspecified quadrant: Secondary | ICD-10-CM

## 2023-07-20 DIAGNOSIS — N6032 Fibrosclerosis of left breast: Secondary | ICD-10-CM | POA: Diagnosis not present

## 2023-07-20 DIAGNOSIS — R92332 Mammographic heterogeneous density, left breast: Secondary | ICD-10-CM | POA: Diagnosis not present

## 2023-07-20 DIAGNOSIS — R928 Other abnormal and inconclusive findings on diagnostic imaging of breast: Secondary | ICD-10-CM

## 2023-07-20 HISTORY — PX: BREAST BIOPSY: SHX20

## 2023-07-24 LAB — SURGICAL PATHOLOGY

## 2023-08-07 DIAGNOSIS — L820 Inflamed seborrheic keratosis: Secondary | ICD-10-CM | POA: Diagnosis not present

## 2023-08-07 DIAGNOSIS — L728 Other follicular cysts of the skin and subcutaneous tissue: Secondary | ICD-10-CM | POA: Diagnosis not present

## 2023-08-20 DIAGNOSIS — F411 Generalized anxiety disorder: Secondary | ICD-10-CM | POA: Diagnosis not present

## 2023-08-20 DIAGNOSIS — F339 Major depressive disorder, recurrent, unspecified: Secondary | ICD-10-CM | POA: Diagnosis not present

## 2023-10-22 ENCOUNTER — Encounter: Payer: Self-pay | Admitting: Dermatology

## 2023-10-22 ENCOUNTER — Other Ambulatory Visit: Payer: Self-pay

## 2023-10-22 DIAGNOSIS — L639 Alopecia areata, unspecified: Secondary | ICD-10-CM

## 2023-10-22 DIAGNOSIS — L649 Androgenic alopecia, unspecified: Secondary | ICD-10-CM

## 2023-10-22 MED ORDER — SAFETY SEAL MISCELLANEOUS MISC: 1.0000 | Freq: Every morning | 4 refills | Status: DC

## 2023-11-19 DIAGNOSIS — F411 Generalized anxiety disorder: Secondary | ICD-10-CM | POA: Diagnosis not present

## 2023-11-19 DIAGNOSIS — F339 Major depressive disorder, recurrent, unspecified: Secondary | ICD-10-CM | POA: Diagnosis not present

## 2023-11-19 DIAGNOSIS — F341 Dysthymic disorder: Secondary | ICD-10-CM | POA: Diagnosis not present

## 2023-11-29 IMAGING — CR DG CHEST 2V
2 series · 2 of 2 positions shown · non-contrast
Comparison: June 22, 2015.

CLINICAL DATA: Chest pain.

EXAM:
CHEST - 2 VIEW

[w chest pa]
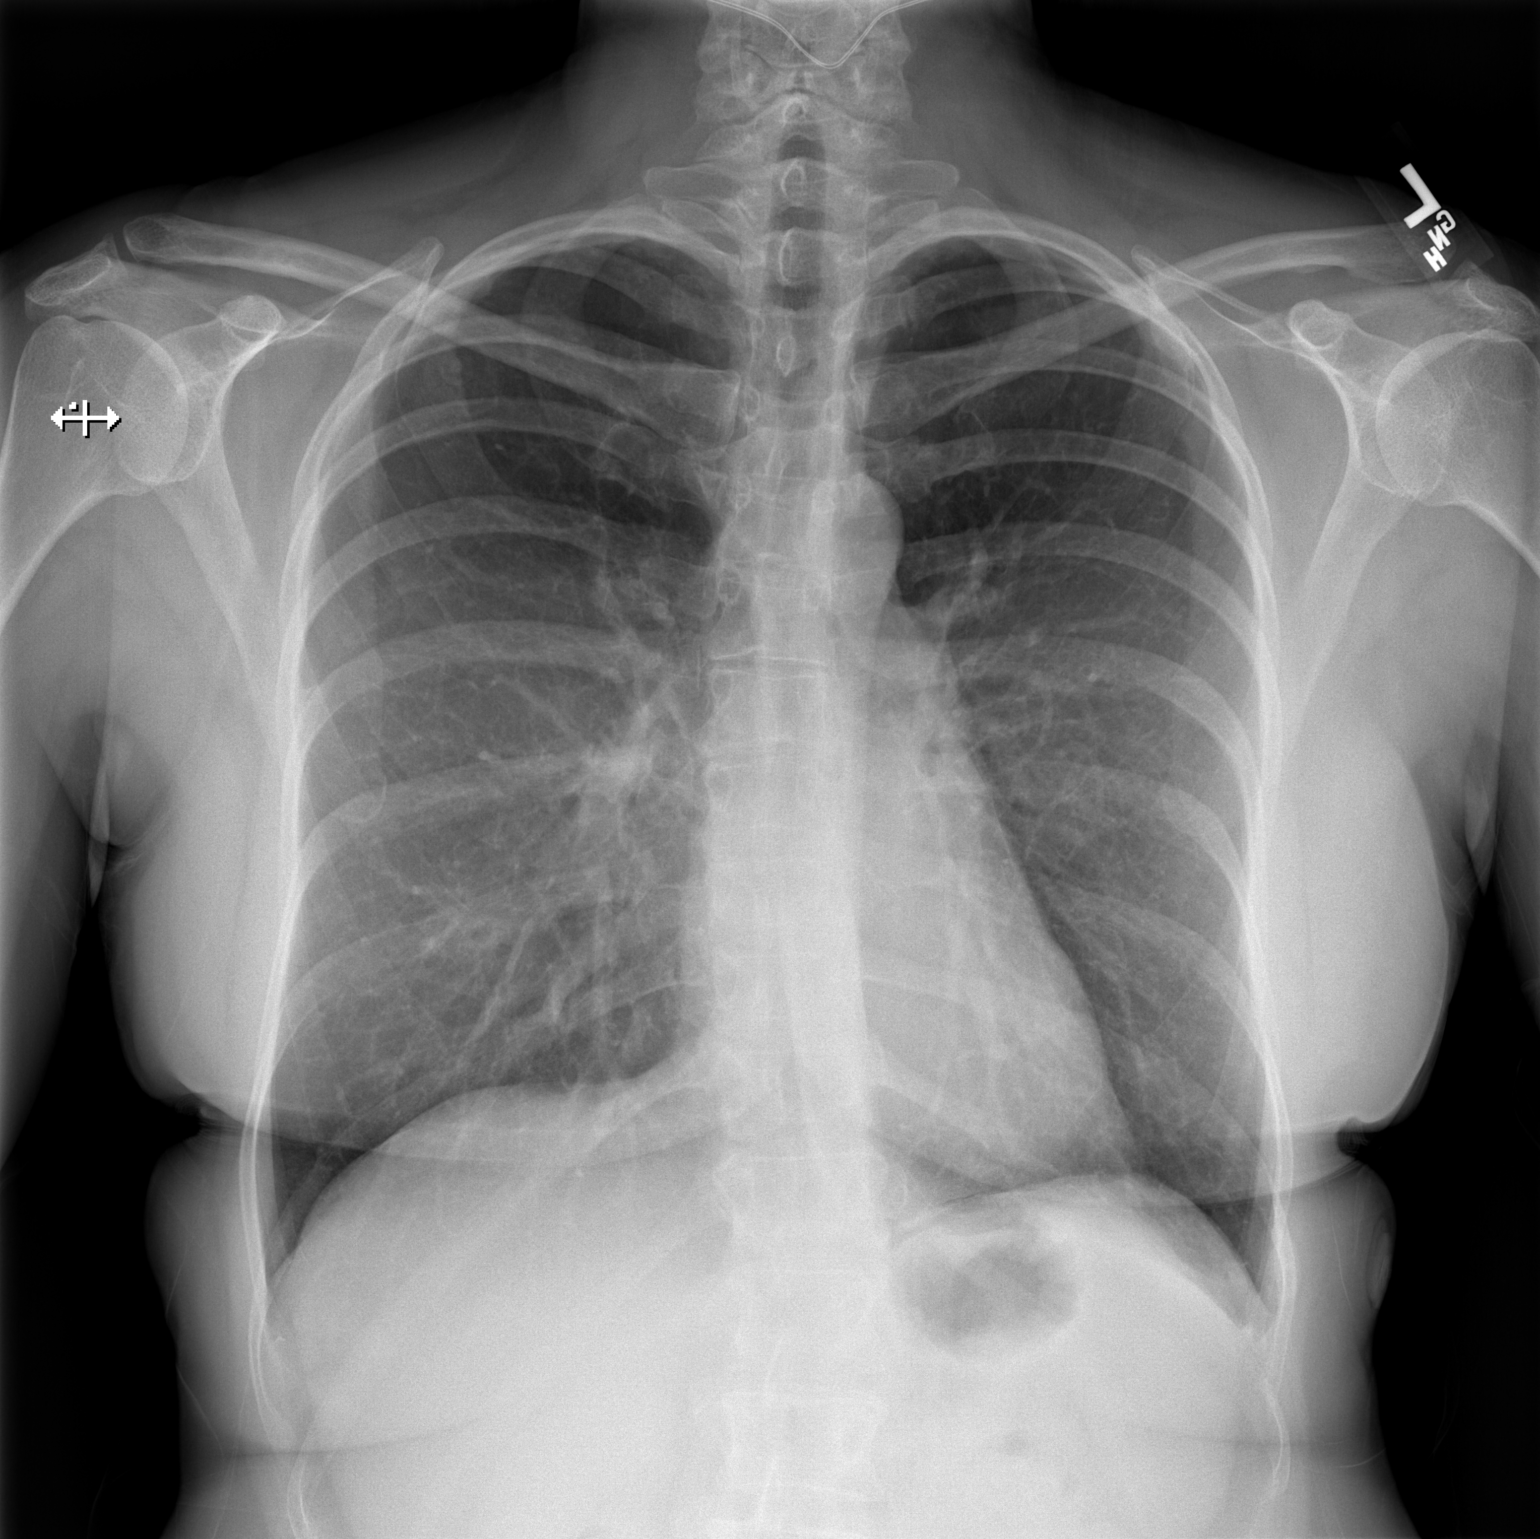

[w chest lat]
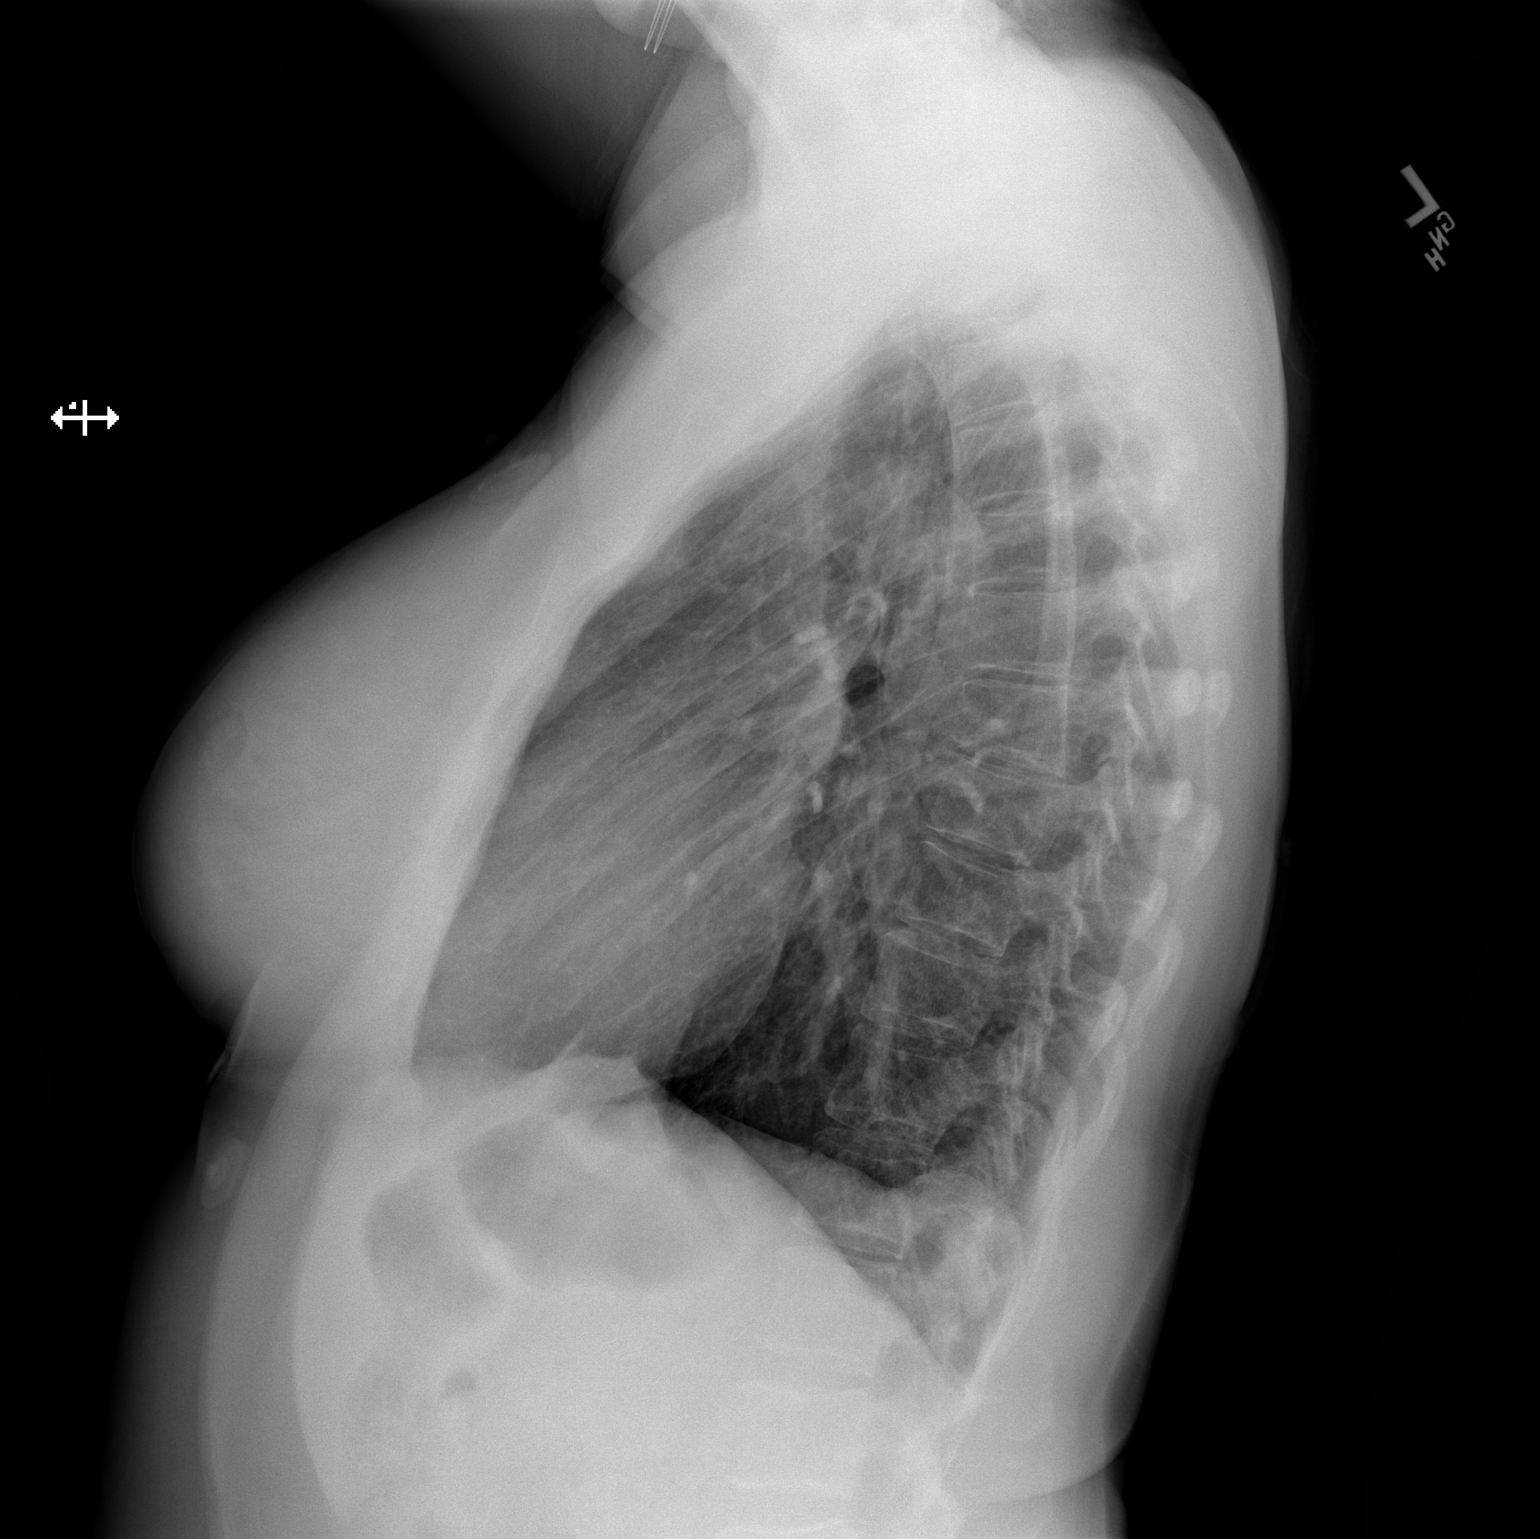

[2 of 2 positions shown; findings below may reference images not displayed]

FINDINGS: No consolidation. No visible pleural effusions or pneumothorax.
Cardiomediastinal silhouette is within normal limits and unchanged.
No evidence of acute osseous abnormality.
IMPRESSION: No evidence of acute cardiopulmonary disease.

## 2024-01-07 ENCOUNTER — Encounter: Payer: Self-pay | Admitting: Dermatology

## 2024-01-07 ENCOUNTER — Ambulatory Visit: Admitting: Dermatology

## 2024-01-07 DIAGNOSIS — Z79899 Other long term (current) drug therapy: Secondary | ICD-10-CM

## 2024-01-07 DIAGNOSIS — L649 Androgenic alopecia, unspecified: Secondary | ICD-10-CM

## 2024-01-07 DIAGNOSIS — L639 Alopecia areata, unspecified: Secondary | ICD-10-CM

## 2024-01-07 MED ORDER — SAFETY SEAL MISCELLANEOUS MISC: 1.0000 | Freq: Every morning | 12 refills | Status: AC

## 2024-01-07 NOTE — Progress Notes (Signed)
   Follow-Up Visit   Subjective  Lacey Hernandez is a 48 y.o. female who presents for the following: Androgenetic Alopecia  Patient present today for follow up visit for Androgenetic Alopecia. Patient was last evaluated on 07/05/2023. At this visit patient was prescribed AA Gel & Vivascal and vital Protein. Patient reports sxs are improving. She reports she does not have as much shedding since starting the topical. Patient denies medication changes.  Patient provided consent to use [employer] system during visit today.  The following portions of the chart were reviewed this encounter and updated as appropriate: medications, allergies, medical history  Review of Systems:  No other skin or systemic complaints except as noted in HPI or Assessment and Plan.  Objective  Well appearing patient in no apparent distress; mood and affect are within normal limits.  A focused examination was performed of the following areas: Scalp  Relevant exam findings are noted in the Assessment and Plan.         Assessment & Plan    Androgenic alopecia  Significant hair regrowth over the last four months. Current treatment includes topical minoxidil and finasteride, along with oral Viviscal and vital proteins. Hair regrowth is expected to continue over the next 12 to 18 months. Consistent use of topical treatments is necessary to maintain results. Temporary cessation of treatment for up to two weeks will not result in hair loss, but cessation for three months will lead to gradual hair shedding.  - Continue topical minoxidil and finasteride. - Continue oral Viviscal and vital proteins. - Refilled prescriptions with eleven refills. - Advised daily use of topical treatments, with the option to reduce to every other day after six months if preferred. - Scheduled follow-up in one year.   Long term medication management.  Patient is using long term (months to years) prescription medication  to control  their dermatologic condition.  These medications require periodic monitoring to evaluate for efficacy and side effects and may require periodic laboratory monitoring.    ANDROGENETIC ALOPECIA   Related Medications Safety Seal Miscellaneous MISC Apply 1 Application topically in the morning. Medication: Hormonic Hair Solution (Minoxidil 8% and Finasteride 0.05%) ALOPECIA AREATA   Related Medications Safety Seal Miscellaneous MISC Apply 1 Application topically in the morning. Medication: Hormonic Hair Solution (Minoxidil 8% and Finasteride 0.05%)  Return in about 1 year (around 01/06/2025) for Androgenetic Alopecia F/U.  I, Jetta Ager, am acting as neurosurgeon for [employer], DO.  Documentation: I have reviewed the above documentation for accuracy and completeness, and I agree with the above.  Delon Lenis, DO

## 2024-01-07 NOTE — Patient Instructions (Signed)

## 2025-01-12 ENCOUNTER — Ambulatory Visit: Admitting: Dermatology
